# Patient Record
Sex: Female | Born: 1995 | Race: Black or African American | Hispanic: No | Marital: Single | Smoking: Never smoker
Health system: Southern US, Community
[De-identification: ages and names within clinical notes are randomized; demographics above are authoritative.]

## PROBLEM LIST (undated history)

## (undated) DIAGNOSIS — J45909 Unspecified asthma, uncomplicated: Secondary | ICD-10-CM

## (undated) HISTORY — PX: DENTAL SURGERY: SHX609

---
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
## 2020-02-09 ENCOUNTER — Ambulatory Visit: Admit: 2020-02-09 | Discharge: 2020-02-09 | Payer: BLUE CROSS/BLUE SHIELD | Attending: Family

## 2020-02-09 DIAGNOSIS — Z20822 Contact with and (suspected) exposure to covid-19: Secondary | ICD-10-CM

## 2020-02-09 NOTE — Progress Notes (Signed)
[facility]  [facility]   [address]  [place] [id number]  Dept: [phone]  Dept Fax: [phone]    Herbert Spiresngela A Theard is a 24 y.o. female who presents today forher medical conditions/complaints as noted below.  Herbert SpiresAngela A Bearman is c/o of   Chief Complaint   Patient presents with   ??? Pharyngitis     onset today otc sinus headache   ??? Otalgia   ??? Dizziness     HPI:     Pharyngitis  This is a new problem. The current episode started today. The problem occurs constantly. The problem has been gradually worsening. Associated symptoms include congestion, headaches and a sore throat. Pertinent negatives include no abdominal pain, chest pain, chills, coughing, fatigue, fever, nausea, neck pain, rash, vomiting or weakness. Associated symptoms comments: Ear fullness, nasal congestion, HA.   . The symptoms are aggravated by swallowing. The treatment provided mild relief.   OTC sinus meds     No past medical history on file.   No past surgical history on file.    No family history on file.    Social History     Tobacco Use   ??? Smoking status: Not on file   Substance Use Topics   ??? Alcohol use: Not on file      Current Outpatient Medications   Medication Sig Dispense Refill   ??? albuterol sulfate HFA 108 (90 Base) MCG/ACT inhaler albuterol sulfate HFA 90 mcg/actuation aerosol inhaler   INHALE 2 PUFFS BY MOUTH EVERY 4 HOURS AS NEEDED     ??? ferrous sulfate (IRON 325) 325 (65 Fe) MG tablet ferrous sulfate 325 mg (65 mg iron) tablet   Take 1 tablet every day by oral route as directed for 90 days.     ??? ibuprofen (ADVIL;MOTRIN) 600 MG tablet ibuprofen 600 mg tablet   TAKE 1 TABLET BY MOUTH EVERY 6 HOURS AS NEEDED FOR PAIN     ??? methocarbamol (ROBAXIN) 500 MG tablet methocarbamol 500 mg tablet   TAKE 1 TABLET BY MOUTH TWICE A DAY     ??? omeprazole (PRILOSEC) 20 MG delayed release capsule omeprazole 20 mg capsule,delayed release   TAKE 1 CAPSULE BY MOUTH EVERY DAY AS DIRECTED     ???  sertraline (ZOLOFT) 100 MG tablet sertraline 100 mg tablet   TAKE 1 TABLET BY MOUTH EVERY DAY     ??? traZODone (DESYREL) 50 MG tablet trazodone 50 mg tablet   TAKE 1/2 1 TABLET BY MOUTH AT BEDTIME AS NEEDED       No current facility-administered medications for this visit.      No Known Allergies        Subjective:      Review of Systems   Constitutional: Negative for appetite change, chills, fatigue and fever.   HENT: Positive for congestion and sore throat. Negative for postnasal drip.    Eyes: Negative.  Negative for discharge and itching.   Respiratory: Negative for cough, chest tightness and shortness of breath.    Cardiovascular: Negative for chest pain, palpitations and leg swelling.   Gastrointestinal: Negative for abdominal pain, diarrhea, nausea and vomiting.   Endocrine: Negative.    Genitourinary: Negative for dysuria, frequency and urgency.   Musculoskeletal: Negative for neck pain and neck stiffness.   Skin: Negative for rash.   Allergic/Immunologic: Negative.    Neurological: Positive for headaches. Negative for dizziness, weakness and light-headedness.   Hematological: Negative for adenopathy.  Psychiatric/Behavioral: Negative for confusion.       Objective:      Physical Exam  Vitals reviewed.   Constitutional:       Appearance: She is well-developed.   HENT:      Head: Normocephalic.      Right Ear: External ear normal. A middle ear effusion is present.      Left Ear: External ear normal. A middle ear effusion is present.      Nose: Nose normal.      Mouth/Throat:      Pharynx: Posterior oropharyngeal erythema present.   Eyes:      Conjunctiva/sclera: Conjunctivae normal.      Pupils: Pupils are equal, round, and reactive to light.   Cardiovascular:      Rate and Rhythm: Normal rate and regular rhythm.      Heart sounds: Normal heart sounds. No murmur heard.   No friction rub. No gallop.    Pulmonary:      Effort: Pulmonary effort is normal. No respiratory distress.      Breath sounds: Normal  breath sounds.   Abdominal:      General: Bowel sounds are normal.      Palpations: Abdomen is soft.   Musculoskeletal:         General: Normal range of motion.      Cervical back: Normal range of motion and neck supple.   Lymphadenopathy:      Cervical: No cervical adenopathy.   Skin:     General: Skin is warm and dry.      Findings: No rash.   Neurological:      Mental Status: She is alert and oriented to person, place, and time.      Cranial Nerves: No cranial nerve deficit.      Coordination: Coordination normal.      Deep Tendon Reflexes: Reflexes are normal and symmetric.   Psychiatric:         Thought Content: Thought content normal.       BP 103/70    Pulse 77    Temp 98.2 ??F (36.8 ??C) (Infrared)    SpO2 100%     Assessment:       Diagnosis Orders   1. Suspected COVID-19 virus infection     2. Sensation of fullness in both ears     3. Acute intractable headache, unspecified headache type     4. Nasal congestion     5. Sore throat             Plan:     1.) Covid swab obtained and sent to lab- will call with results   2.) Quarantine while waiting for Covid results   3.) Symptom management encouraged   4.) Follow-up with PCP PRN     Advance Care Planning  People with COVID-19 may have no symptoms, mild symptoms, such as fever, cough, and shortness of breath or they may have more severe illness, developing severe and fatal pneumonia.  As a result, Advance Care Planning with attention to naming a health care decision maker (someone you trust to make healthcare decisions for you if you could not speak for yourself) and sharing other health care preferences is important BEFORE a possible health crisis. Please contact your Primary Care Provider to discuss Advance Care Planning.    Preventing the Spread of Coronavirus Disease 2019 in Homes and Residential Communities  For the most recent information go to [email or web address]    Prevention steps for People  with  confirmed or suspected COVID-19 (including persons under investigation) who do not need to be hospitalized  and   People with confirmed COVID-19 who were hospitalized and determined to be medically stable to go home    Your healthcare provider and public health staff will evaluate whether you can be cared for at home. If it is determined that you do not need to be hospitalized and can be isolated at home, you will be monitored by staff from your local or state health department. You should follow the prevention steps below until a healthcare provider or local or state health department says you can return to your normal activities.  Stay home except to get medical care  People who are mildly ill with COVID-19 are able to isolate at home during their illness. You should restrict activities outside your home, except for getting medical care. Do not go to work, school, or public areas. Avoid using public transportation, ride-sharing, or taxis.  Separate yourself from other people and animals in your home  People: As much as possible, you should stay in a specific room and away from other people in your home. Also, you should use a separate bathroom, if available.  Animals: You should restrict contact with pets and other animals while you are sick with COVID-19, just like you would around other people. Although there have not been reports of pets or other animals becoming sick with COVID-19, it is still recommended that people sick with COVID-19 limit contact with animals until more information is known about the virus. When possible, have another member of your household care for your animals while you are sick. If you are sick with COVID-19, avoid contact with your pet, including petting, snuggling, being kissed or licked, and sharing food. If you must care for your pet or be around animals while you are sick, wash your hands before and after you interact with pets and wear a facemask.  Call ahead before visiting your  doctor  If you have a medical appointment, call the healthcare provider and tell them that you have or may have COVID-19. This will help the healthcare provider???s office take steps to keep other people from getting infected or exposed.  Wear a facemask  You should wear a facemask when you are around other people (e.g., sharing a room or vehicle) or pets and before you enter a healthcare provider???s office. If you are not able to wear a facemask (for example, because it causes trouble breathing), then people who live with you should not stay in the same room with you, or they should wear a facemask if they enter your room.  Cover your coughs and sneezes  Cover your mouth and nose with a tissue when you cough or sneeze. Throw used tissues in a lined trash can. Immediately wash your hands with soap and water for at least 20 seconds or, if soap and water are not available, clean your hands with an alcohol-based hand sanitizer that contains at least 60% alcohol.  Clean your hands often  Wash your hands often with soap and water for at least 20 seconds, especially after blowing your nose, coughing, or sneezing; going to the bathroom; and before eating or preparing food. If soap and water are not readily available, use an alcohol-based hand sanitizer with at least 60% alcohol, covering all surfaces of your hands and rubbing them together until they feel dry.  Soap and water are the best option if hands are visibly dirty. Avoid  touching your eyes, nose, and mouth with unwashed hands.  Avoid sharing personal household items  You should not share dishes, drinking glasses, cups, eating utensils, towels, or bedding with other people or pets in your home. After using these items, they should be washed thoroughly with soap and water.  Clean all ???high-touch??? surfaces everyday  High touch surfaces include counters, tabletops, doorknobs, bathroom fixtures, toilets, phones, keyboards, tablets, and bedside tables. Also, clean any  surfaces that may have blood, stool, or body fluids on them. Use a household cleaning spray or wipe, according to the label instructions. Labels contain instructions for safe and effective use of the cleaning product including precautions you should take when applying the product, such as wearing gloves and making sure you have good ventilation during use of the product.  Monitor your symptoms  Seek prompt medical attention if your illness is worsening (e.g., difficulty breathing). Before seeking care, call your healthcare provider and tell them that you have, or are being evaluated for, COVID-19. Put on a facemask before you enter the facility. These steps will help the healthcare provider???s office to keep other people in the office or waiting room from getting infected or exposed. Ask your healthcare provider to call the local or state health department. Persons who are placed under active monitoring or facilitated self-monitoring should follow instructions provided by their local health department or occupational health professionals, as appropriate. When working with your local health department check their available hours.  If you have a medical emergency and need to call 911, notify the dispatch personnel that you have, or are being evaluated for COVID-19. If possible, put on a facemask before emergency medical services arrive.  Discontinuing home isolation  Patients with confirmed COVID-19 should remain under home isolation precautions until the risk of secondary transmission to others is thought to be low. The decision to discontinue home isolation precautions should be made on a case-by-case basis, in consultation with healthcare providers and state and local health departments.    Problem List     None           Patient given educationalmaterials - see patient instructions.  Discussed use, benefit, and side effectsof prescribed medications.  All patient questions answered. Pt verbalized  understanding.Instructed to continue current medications, diet and exercise.  Patient agreedwith treatment plan. Follow up as directed.     Electronically signed by Felipe Drone, APRN - CNP on 03/30/2020 at 1:39 PM

## 2020-02-09 NOTE — Patient Instructions (Signed)
Advance Care Planning  People with COVID-19 may have no symptoms, mild symptoms, such as fever, cough, and shortness of breath or they may have more severe illness, developing severe and fatal pneumonia.  As a result, Advance Care Planning with attention to naming a health care decision maker (someone you trust to make healthcare decisions for you if you could not speak for yourself) and sharing other health care preferences is important BEFORE a possible health crisis. Please contact your Primary Care Provider to discuss Advance Care Planning.    Preventing the Spread of Coronavirus Disease 2019 in Homes and Residential Communities  For the most recent information go to https://www.cdc.gov/coronavirus/2019-ncov/hcp/guidance-prevent-spread.html    Prevention steps for People with confirmed or suspected COVID-19 (including persons under investigation) who do not need to be hospitalized  and   People with confirmed COVID-19 who were hospitalized and determined to be medically stable to go home    Your healthcare provider and public health staff will evaluate whether you can be cared for at home. If it is determined that you do not need to be hospitalized and can be isolated at home, you will be monitored by staff from your local or state health department. You should follow the prevention steps below until a healthcare provider or local or state health department says you can return to your normal activities.  Stay home except to get medical care  People who are mildly ill with COVID-19 are able to isolate at home during their illness. You should restrict activities outside your home, except for getting medical care. Do not go to work, school, or public areas. Avoid using public transportation, ride-sharing, or taxis.  Separate yourself from other people and animals in your home  People: As much as possible, you should stay in a specific room and away from other people in your home. Also, you should use a separate  bathroom, if available.  Animals: You should restrict contact with pets and other animals while you are sick with COVID-19, just like you would around other people. Although there have not been reports of pets or other animals becoming sick with COVID-19, it is still recommended that people sick with COVID-19 limit contact with animals until more information is known about the virus. When possible, have another member of your household care for your animals while you are sick. If you are sick with COVID-19, avoid contact with your pet, including petting, snuggling, being kissed or licked, and sharing food. If you must care for your pet or be around animals while you are sick, wash your hands before and after you interact with pets and wear a facemask.  Call ahead before visiting your doctor  If you have a medical appointment, call the healthcare provider and tell them that you have or may have COVID-19. This will help the healthcare provider???s office take steps to keep other people from getting infected or exposed.  Wear a facemask  You should wear a facemask when you are around other people (e.g., sharing a room or vehicle) or pets and before you enter a healthcare provider???s office. If you are not able to wear a facemask (for example, because it causes trouble breathing), then people who live with you should not stay in the same room with you, or they should wear a facemask if they enter your room.  Cover your coughs and sneezes  Cover your mouth and nose with a tissue when you cough or sneeze. Throw used tissues in a lined trash   can. Immediately wash your hands with soap and water for at least 20 seconds or, if soap and water are not available, clean your hands with an alcohol-based hand sanitizer that contains at least 60% alcohol.  Clean your hands often  Wash your hands often with soap and water for at least 20 seconds, especially after blowing your nose, coughing, or sneezing; going to the bathroom; and  before eating or preparing food. If soap and water are not readily available, use an alcohol-based hand sanitizer with at least 60% alcohol, covering all surfaces of your hands and rubbing them together until they feel dry.  Soap and water are the best option if hands are visibly dirty. Avoid touching your eyes, nose, and mouth with unwashed hands.  Avoid sharing personal household items  You should not share dishes, drinking glasses, cups, eating utensils, towels, or bedding with other people or pets in your home. After using these items, they should be washed thoroughly with soap and water.  Clean all ???high-touch??? surfaces everyday  High touch surfaces include counters, tabletops, doorknobs, bathroom fixtures, toilets, phones, keyboards, tablets, and bedside tables. Also, clean any surfaces that may have blood, stool, or body fluids on them. Use a household cleaning spray or wipe, according to the label instructions. Labels contain instructions for safe and effective use of the cleaning product including precautions you should take when applying the product, such as wearing gloves and making sure you have good ventilation during use of the product.  Monitor your symptoms  Seek prompt medical attention if your illness is worsening (e.g., difficulty breathing). Before seeking care, call your healthcare provider and tell them that you have, or are being evaluated for, COVID-19. Put on a facemask before you enter the facility. These steps will help the healthcare provider???s office to keep other people in the office or waiting room from getting infected or exposed. Ask your healthcare provider to call the local or state health department. Persons who are placed under active monitoring or facilitated self-monitoring should follow instructions provided by their local health department or occupational health professionals, as appropriate. When working with your local health department check their available hours.  If you  have a medical emergency and need to call 911, notify the dispatch personnel that you have, or are being evaluated for COVID-19. If possible, put on a facemask before emergency medical services arrive.  Discontinuing home isolation  Patients with confirmed COVID-19 should remain under home isolation precautions until the risk of secondary transmission to others is thought to be low. The decision to discontinue home isolation precautions should be made on a case-by-case basis, in consultation with healthcare providers and state and local health departments.

## 2020-02-11 LAB — COVID-19: SARS-CoV-2: NOT DETECTED

## 2020-06-04 ENCOUNTER — Ambulatory Visit: Admit: 2020-06-04 | Discharge: 2020-06-04 | Payer: BLUE CROSS/BLUE SHIELD | Attending: Family

## 2020-06-04 ENCOUNTER — Inpatient Hospital Stay: Payer: BLUE CROSS/BLUE SHIELD

## 2020-06-04 DIAGNOSIS — Z20822 Contact with and (suspected) exposure to covid-19: Secondary | ICD-10-CM

## 2020-06-04 MED ORDER — FLUTICASONE PROPIONATE 50 MCG/ACT NA SUSP
50 MCG/ACT | Freq: Every day | NASAL | 0 refills | Status: AC
Start: 2020-06-04 — End: ?

## 2020-06-04 MED ORDER — PREDNISONE 20 MG PO TABS
20 MG | ORAL_TABLET | Freq: Two times a day (BID) | ORAL | 0 refills | Status: AC
Start: 2020-06-04 — End: 2020-06-09

## 2020-06-04 MED ORDER — BENZONATATE 100 MG PO CAPS
100 MG | ORAL_CAPSULE | Freq: Three times a day (TID) | ORAL | 0 refills | Status: AC | PRN
Start: 2020-06-04 — End: 2020-06-11

## 2020-06-04 NOTE — Progress Notes (Signed)
[facility] [place], [facility]  [address]  [place] [address]  Dept: [phone]  Dept Fax: [phone]    Dawn Patrick is a 25 y.o. female who presents to the urgent care today for her medical conditions/complaints as notedbelow.  [name] is c/o of Cough (onset 2 days ), Congestion, and Pharyngitis      HPI:     25 yr old female presents for Cough (onset 2 days ), Congestion, and Pharyngitis  Hx asthma, breathing heavier  covid vax: 2 doses    Cough  This is a new problem. The current episode started in the past 7 days. The problem has been waxing and waning. The cough is non-productive. Associated symptoms include chills, headaches, postnasal drip, rhinorrhea, shortness of breath and sweats. Pertinent negatives include no chest pain, ear congestion, ear pain, fever, heartburn, hemoptysis, myalgias, nasal congestion, rash, weight loss or wheezing. The symptoms are aggravated by lying down. She has tried a beta-agonist inhaler for the symptoms. Her past medical history is significant for asthma, bronchitis and environmental allergies. There is no history of bronchiectasis, COPD, emphysema or pneumonia.   Pharyngitis  Associated symptoms include chills, coughing and headaches. Pertinent negatives include no chest pain, fever, myalgias or rash.       No past medical history on file.     Current Outpatient Medications   Medication Sig Dispense Refill   ??? predniSONE (DELTASONE) 20 MG tablet Take 1 tablet by mouth 2 times daily for 5 days 10 tablet 0   ??? fluticasone (FLONASE) 50 MCG/ACT nasal spray 2 sprays by Nasal route daily 16 g 0   ??? benzonatate (TESSALON PERLES) 100 MG capsule Take 1 capsule by mouth 3 times daily as needed for Cough 21 capsule 0   ??? albuterol sulfate HFA 108 (90 Base) MCG/ACT inhaler albuterol sulfate HFA 90 mcg/actuation aerosol inhaler   INHALE 2 PUFFS BY MOUTH EVERY 4 HOURS AS NEEDED     ??? ferrous  sulfate (IRON 325) 325 (65 Fe) MG tablet ferrous sulfate 325 mg (65 mg iron) tablet   Take 1 tablet every day by oral route as directed for 90 days.     ??? ibuprofen (ADVIL;MOTRIN) 600 MG tablet ibuprofen 600 mg tablet   TAKE 1 TABLET BY MOUTH EVERY 6 HOURS AS NEEDED FOR PAIN     ??? sertraline (ZOLOFT) 100 MG tablet sertraline 100 mg tablet   TAKE 1 TABLET BY MOUTH EVERY DAY     ??? traZODone (DESYREL) 50 MG tablet trazodone 50 mg tablet   TAKE 1/2 1 TABLET BY MOUTH AT BEDTIME AS NEEDED     ??? methocarbamol (ROBAXIN) 500 MG tablet methocarbamol 500 mg tablet   TAKE 1 TABLET BY MOUTH TWICE A DAY (Patient not taking: Reported on 06/04/2020)     ??? omeprazole (PRILOSEC) 20 MG delayed release capsule omeprazole 20 mg capsule,delayed release   TAKE 1 CAPSULE BY MOUTH EVERY DAY AS DIRECTED (Patient not taking: Reported on 06/04/2020)       No current facility-administered medications for this visit.     Allergies   Allergen Reactions   ??? Penicillins        Subjective:      Review of Systems   Constitutional: Positive for chills. Negative for fever and weight loss.   HENT: Positive for postnasal drip and rhinorrhea. Negative for ear pain.    Respiratory: Positive for cough and  shortness of breath. Negative for hemoptysis and wheezing.    Cardiovascular: Negative for chest pain.   Gastrointestinal: Negative for heartburn.   Musculoskeletal: Negative for myalgias.   Skin: Negative for rash.   Allergic/Immunologic: Positive for environmental allergies.   Neurological: Positive for headaches.   All other systems reviewed and are negative.    14 systems reviewed and negative except as listed in HPI.    Objective:     Physical Exam  Vitals and nursing note reviewed.   Constitutional:       General: She is not in acute distress.     Appearance: Normal appearance. She is well-developed. She is not ill-appearing, toxic-appearing or diaphoretic.   HENT:      Head: Normocephalic and atraumatic.      Right Ear: Tympanic membrane, ear canal and  external ear normal.      Left Ear: Tympanic membrane, ear canal and external ear normal.      Nose: Rhinorrhea present.      Mouth/Throat:      Mouth: Mucous membranes are moist.      Pharynx: No oropharyngeal exudate or posterior oropharyngeal erythema.      Comments: Thin white PND  Eyes:      General: No scleral icterus.        Right eye: No discharge.         Left eye: No discharge.      Extraocular Movements: Extraocular movements intact.      Conjunctiva/sclera: Conjunctivae normal.      Pupils: Pupils are equal, round, and reactive to light.   Cardiovascular:      Rate and Rhythm: Normal rate and regular rhythm.      Pulses: Normal pulses.      Heart sounds: Normal heart sounds.   Pulmonary:      Effort: Pulmonary effort is normal. No respiratory distress.      Breath sounds: Normal breath sounds. No stridor. No wheezing, rhonchi or rales.      Comments: Dry hacky cough  Chest:      Chest wall: No tenderness.   Abdominal:      General: Bowel sounds are normal. There is no distension.      Palpations: Abdomen is soft.      Tenderness: There is no abdominal tenderness.   Musculoskeletal:         General: No tenderness or deformity. Normal range of motion.      Cervical back: Normal range of motion and neck supple.   Lymphadenopathy:      Cervical: No cervical adenopathy.   Skin:     General: Skin is warm and dry.      Capillary Refill: Capillary refill takes less than 2 seconds.      Findings: No rash.      Comments: No rash to visible skin   Neurological:      General: No focal deficit present.      Mental Status: She is alert and oriented to person, place, and time.      Motor: No abnormal muscle tone.      Coordination: Coordination normal.   Psychiatric:         Mood and Affect: Mood normal.         Behavior: Behavior normal.       BP 120/80    Pulse 83    Temp 98.2 ??F (36.8 ??C)    SpO2 98%     Assessment:  Diagnosis Orders   1. Suspected COVID-19 virus infection  COVID-19       Plan:    hx asthma,  has had chest tightness, dry hacky cough on exam  vss  ls clr t/o no fevers  pred rx  Tess perles rx  flonase rx  Home alb inhaler - pt declines refill  Reviewed over-the-counter treatments for symptom management.  Will send out COVID19 testing. Possible treatment alterations based on the results.  Patient instructed to self-quarantine until testing results are back.  Patient instructed not to return to work until results are back.  Tylenol as needed for fever/pain.  Encouraged adequate hydration and rest.  The patient indicates understanding of these issues and agrees with the plan.  Educational materials provided on AVS.  Follow up if symptoms do not improve/worsen. Discussed symptoms that will warrant urgent ED evaluation/treatment.    Return if symptoms worsen or fail to improve, for Make an Appt. with your Primary Care in 1 week.    Orders Placed This Encounter   Medications   ??? predniSONE (DELTASONE) 20 MG tablet     Sig: Take 1 tablet by mouth 2 times daily for 5 days     Dispense:  10 tablet     Refill:  0   ??? fluticasone (FLONASE) 50 MCG/ACT nasal spray     Sig: 2 sprays by Nasal route daily     Dispense:  16 g     Refill:  0   ??? benzonatate (TESSALON PERLES) 100 MG capsule     Sig: Take 1 capsule by mouth 3 times daily as needed for Cough     Dispense:  21 capsule     Refill:  0         Patient given educational materials - see patient instructions.  Discussed use, benefit, and side effects of prescribed medications.  All patient questions answered.  Pt voicedunderstanding.    Electronically signed by Sharion Settler, APRN - CNP on 06/04/2020 at 2:26 PM

## 2020-06-04 NOTE — Patient Instructions (Signed)
Follow up with family doctor in 1 week as needed.  Return immediately if worse, new symptoms develop, symptoms persist or have any questions or concerns.  Push fluids, keep hydrated  Cool mist humidifier bedside  Continue all medications as prescribed  May alternate tylenol/motrin over the counter for pain or fever, take per package instructions.  The COVID-19 test that was done today can take 1-6 days for results.  Until then you should assume you have this disease and adhere to home isolation as described below.    When we get the test results back, one of the following readings will be obtained.   1. A positive test means you have the virus.    2.  An inconclusive test means it wasn't sure if you have the virus or not.   An inconclusive test result is treated as a positive result and recommendations  are the same as a positive test result.  We may ask you to repeat this test in this circumstance.    3.  A negative test means you probably do not have the virus, but it is not conclusive.      If your results of the COVID-19 test is POSITIVE- you must isolate yourself from others.    You can be around others after:    10 days since symptoms first appeared (immunocompromised will quarantine for 20 days) and  24 hours with no fever without the use of fever-reducing medications and  Other symptoms of COVID-19 are improving*  *Loss of taste and smell may persist for weeks or months after recovery and need not delay the end of isolation  For people who are immunocopromised, quarantine may last for 20 days.    Most people do not require testing to decide when they can be around others; however, if your healthcare provider recommends testing, they will let you know when you can resume being around others based on your test results.    Note that these recommendations do not apply to persons with severe COVID-19 or with severely weakened immune systems (immunocompromised). These persons should follow the guidance below for  ???I was severely ill with COVID-19 or have a severely weakened immune system (immunocompromised) due to a health condition or medication. When can I be around others????    CDC does NOT recommend retesting to determine if the infection has resolved, when to end home isolation or whether to discontinue precautions in a healthcare setting.  Your test can remain positive for 90 days, but that does not mean you are contagious.  Some patients who are immunocompromised or had severe illness are sometimes retested in consultation with infectious disease experts.    https://www.cdc.gov/coronavirus/2019-ncov/if-you-are-sick/end-home-isolation.html    Prevention steps for People with positive or inconclusive test results or suspected  COVID-19 (including persons under investigation) who do not need to be hospitalized  and   People with confirmed COVID-19 who were hospitalized and determined to be medically stable to go home    Contacts who are NOT healthcare providers or first responders and are asymptomatic (no fever,  cough, shortness of breath, or difficulty breathing) should self-quarantine for 14 days from the last  date of exposure to confirmed COVID-19.    Your healthcare provider and public health staff will evaluate whether you can be cared for at home. If it is determined that you do not need to be hospitalized and can be isolated at home, you will be monitored by staff from your health department. You should follow the   prevention steps below until a healthcare provider or local or state health department says you can return to your normal activities.  Stay home except to get medical care  People who are mildly ill with COVID-19 are able to isolate at home during their illness. You should restrict activities outside your home, except for getting medical care. Do not go to work, school, or public areas. Avoid using public transportation, ride-sharing, or taxis.  Separate yourself from other people and animals in your  home  People: As much as possible, you should stay in a specific room and away from other people in your home. Also, you should use a separate bathroom, if available.  Animals: You should restrict contact with pets and other animals while you are sick with COVID-19, just like you would around other people. When possible, have another member of your household care for your animals while you are sick. If you are sick with COVID-19, avoid contact with your pet, including petting, snuggling, being kissed or licked, and sharing food. If you must care for your pet or be around animals while you are sick, wash your hands before and after you interact with pets and wear a facemask.  Call ahead before visiting your doctor  If you have a medical appointment, call the healthcare provider and tell them that you have or may have COVID-19. This will help the healthcare provider???s office take steps to keep other people from getting infected or exposed.  Wear a facemask  You should wear a facemask when you are around other people (e.g., sharing a room or vehicle) or pets and before you enter a healthcare provider???s office. If you are not able to wear a facemask (for example, because it causes trouble breathing), then people who live with you should not stay in the same room with you; they should also wear a facemask if they enter your room.  Cover your coughs and sneezes  Cover your mouth and nose with a tissue when you cough or sneeze. Throw used tissues in a lined trash can. Immediately wash your hands with soap and water for at least 20 seconds or, if soap and water are not available, clean your hands with an alcohol-based hand sanitizer that contains at least 60% alcohol.  Clean your hands often  Wash your hands often with soap and water for at least 20 seconds, especially after blowing your nose, coughing, or sneezing; going to the bathroom; and before eating or preparing food. If soap and water are not readily available, use  an alcohol-based hand sanitizer with at least 60% alcohol, covering all surfaces of your hands and rubbing them together until they feel dry.  Soap and water are the best option if hands are visibly dirty. Avoid touching your eyes, nose, and mouth with unwashed hands.  Avoid sharing personal household items  You should not share dishes, drinking glasses, cups, eating utensils, towels, or bedding with other people or pets in your home. After using these items, they should be washed thoroughly with soap and water.  Clean all ???high-touch??? surfaces everyday  High touch surfaces include counters, tabletops, doorknobs, bathroom fixtures, toilets, phones, keyboards, tablets, and bedside tables. Also, clean any surfaces that may have blood, stool, or body fluids on them. Use a household cleaning spray or wipe, according to the label instructions. Labels contain instructions for safe and effective use of the cleaning product including precautions you should take when applying the product, such as wearing gloves and making sure   you have good ventilation during use of the product.  Monitor your symptoms  Seek prompt medical attention if your illness is worsening (e.g., difficulty breathing). Before seeking care, call your healthcare provider and tell them that you have, or are being evaluated for, COVID-19. Put on a facemask before you enter the facility. These steps will help the healthcare provider???s office to keep other people in the office or waiting room from getting infected or exposed. Persons who are placed under active monitoring or facilitated self-monitoring should follow instructions provided by their local health department or occupational health professionals, as appropriate. When working with your local health department check their available hours.  If you have a medical emergency and need to call 911, notify the dispatch personnel that you have, or are being evaluated for COVID-19. If possible, put on a  facemask before emergency medical services arrive.  Discontinuing home isolation  Patients with confirmed COVID-19 should remain under home isolation precautions until the risk of secondary transmission to others is thought to be low. The decision to discontinue home isolation precautions should be made on a case-by-case basis, in consultation with your physician and the health department.  Please do NOT make this decision on your own.          Quarantine- keeps someone who might have been exposed to the virus away from others  Isolation - keeps someone who is infected with the virus away from others, even in their homes    Scenario 1    Your patient has close contact with an individual who has COVID-19.  Your patient will not have further contact.  Plan - Your patient is quarantined from the last day of contact for 14 days    Scenario 2   Your patient has lives with someone who has COVID-19 but can avoid further contact.  Plan - Your patient is quarantined for 14 days starting when the person with COVID-19 begins home isolation.    Scenario 3    Your patient is under quarantine and has additional close contact with someone else who has COVID-19.  Plan - Your patient must restart quarantine from the last COVID-19 exposure.    Scenario 4   Your patient lives with someone who has COVID-19 and cannot avoid close contact from them.    Plan - Your patient is quarantined while the other person is isolating and for 14 days after covid - 19 person meets the criteria to end home isolation.      Treatment with monclonoal antibodies is under investigation and can be considered for patients with mild to moderate COVID-19 who are not on supplemental oxygen and are not hospitalized but who are at HIGH RISK for clinical progression have had onset of symptoms within the past 10 days.     HIGH RISK is defined as patients who meet at least one of the following criteria:  ???  Have a body mass index (BMI) ?35  ???  Have chronic kidney  disease  ???  Have diabetes  ???  Have immunosuppressive disease  ???  Are currently receiving immunosuppressive treatment  ???  Are ?24 years of age  ???  Are ?24 years of age AND have  *cardiovascular disease, OR  *hypertension, OR  *chronic obstructive pulmonary disease/other chronic respiratory disease.  ???  Are 12 - 17 years of age AND have  *BMI ?85th percentile for their age and gender based on CDC growth charts, https://www.cdc.gov/growthcharts/clinical_charts.htm , OR  *sickle cell disease, OR  *  congenital or acquired heart disease, OR  *neurodevelopmental disorders, for example, cerebral palsy, OR  *a medical-related technological dependence, for example, tracheostomy, gastrostomy, or positive pressure ventilation (not related to   COVID-19), OR  *asthma, reactive airway or other chronic respiratory disease that requires daily medication for control.  Other risk factors:  ??? Moderate/severe dementia  ??? Cancer being treated with palliative care  ??? Cirrhosis  ??? Pregnancy  ??? Breast feeding  ??? Weight less than 40Kg (88lbs)      If your results of the COVID-19 test is NEGATIVE -     The patient may stop isolation, in consultation with your health care provider, typically when:  Your healthcare provider has determined that the cause of the illness is NOT COVID-19 and approves your return to work.  OR  Ten (10) days have passed since onset of symptoms AND one day (24 hours) have passed with no fever without taking medication (like Tylenol) to reduce fever,  respiratory symptoms have resolved and you have been evaluated by your health care provider.  Please follow up with your physician for evaluation about this.          COVID-19 EXPOSURE    Quarantine  Quarantine if you have been in close contact (within 6 feet of someone for a cumulative total of 15 minutes or more over a 24-hour period) with someone who has COVID-19, unless you have been fully vaccinated. People who are fully vaccinated do NOT need to quarantine after  contact with someone who had COVID-19 unless they have symptoms. However, fully vaccinated people should get tested 3-5 days after their exposure, even if they don???t have symptoms and wear a mask indoors in public for 14 days following exposure or until their test result is negative.    The following websites are the best places for up to date information on this fluid situation.    Https://www.cdc.gov/coronavirus    The following applies to a person who has clinically recovered from  SARS-CoV-2 infection that was confirmed with a viral diagnostic test and then, within 3 months since the date of symptom onset of the previous illness episode (or date of positive viral diagnostic test if the person never experienced symptoms), is identified as a contact of a new case.  If the person remains asymptomatic since the new exposure, then they do not need to be retested for SARS-CoV-2 and do not need to be quarantined. However, if the person experiences new symptoms consistent with COVID-19 and an evaluation fails to identify a diagnosis other than SARS-CoV-2 infection (e.g., influenza), then repeat viral diagnostic testing may be warranted, in consultation with an infectious disease specialist and public health authorities for isolation guidance.

## 2020-06-06 LAB — COVID-19: SARS-CoV-2: DETECTED — AB

## 2020-10-31 ENCOUNTER — Encounter (HOSPITAL_COMMUNITY): Payer: Self-pay

## 2020-10-31 ENCOUNTER — Other Ambulatory Visit: Payer: Self-pay

## 2020-10-31 ENCOUNTER — Ambulatory Visit (HOSPITAL_COMMUNITY)
Admission: RE | Admit: 2020-10-31 | Discharge: 2020-10-31 | Disposition: A | Discharge status: Home / Self Care | Payer: Managed Care, Other (non HMO) | Source: Ambulatory Visit | Attending: Urgent Care | Admitting: Urgent Care

## 2020-10-31 VITALS — BP 116/66 | HR 67 | Temp 98.1°F | Resp 18

## 2020-10-31 DIAGNOSIS — Z79899 Other long term (current) drug therapy: Secondary | ICD-10-CM | POA: Insufficient documentation

## 2020-10-31 DIAGNOSIS — J069 Acute upper respiratory infection, unspecified: Secondary | ICD-10-CM | POA: Insufficient documentation

## 2020-10-31 DIAGNOSIS — Z20822 Contact with and (suspected) exposure to covid-19: Secondary | ICD-10-CM | POA: Insufficient documentation

## 2020-10-31 DIAGNOSIS — J45909 Unspecified asthma, uncomplicated: Secondary | ICD-10-CM | POA: Diagnosis not present

## 2020-10-31 HISTORY — DX: Unspecified asthma, uncomplicated: J45.909

## 2020-10-31 LAB — SARS CORONAVIRUS 2 (TAT 6-24 HRS): SARS Coronavirus 2: NEGATIVE

## 2020-10-31 MED ORDER — ALBUTEROL SULFATE HFA 108 (90 BASE) MCG/ACT IN AERS: 1.0000 | INHALATION_SPRAY | Freq: Four times a day (QID) | RESPIRATORY_TRACT | 0 refills | Status: AC | PRN

## 2020-10-31 MED ORDER — PROMETHAZINE-DM 6.25-15 MG/5ML PO SYRP: 5.0000 mL | ORAL_SOLUTION | Freq: Every evening | ORAL | 0 refills | Status: AC | PRN

## 2020-10-31 MED ORDER — PREDNISONE 10 MG PO TABS: 10.0000 mg | ORAL_TABLET | Freq: Every day | ORAL | 0 refills | Status: AC

## 2020-10-31 MED ORDER — PSEUDOEPHEDRINE HCL 60 MG PO TABS: 60.0000 mg | ORAL_TABLET | Freq: Three times a day (TID) | ORAL | 0 refills | Status: AC | PRN

## 2020-10-31 MED ORDER — CETIRIZINE HCL 10 MG PO TABS: 10.0000 mg | ORAL_TABLET | Freq: Every day | ORAL | 0 refills | Status: AC

## 2020-10-31 NOTE — ED Provider Notes (Signed)
[facility]   MRN: [id number] DOB: [birth year]  Subjective:   Jennifer Stanley is a 25 y.o. female presenting for 10 day history of acute onset malaise including runny nose, watery eyes, throat pain, swollen lymph nodes around her neck, headaches, diarrhea, bilateral ear popping, fatigue.  Had a negative rapid COVID test on 05/26.  Has used Tylenol Cold for her symptoms.  She just ran out of her albuterol inhaler.  Denies active chest pain, shortness of breath, fevers.  She is COVID vaccinated but does not have the booster.  No current facility-administered medications for this encounter.  Current Outpatient Medications:  .  fluticasone (FLONASE) 50 MCG/ACT nasal spray, Place 2 sprays into both nostrils daily., Disp: , Rfl:  .  sertraline (ZOLOFT) 100 MG tablet, Take 1.5 tablets by mouth daily., Disp: , Rfl:  .  traZODone (DESYREL) 50 MG tablet, Take 25-50 mg by mouth at bedtime as needed., Disp: , Rfl:    Allergies  Allergen Reactions  . Penicillins     Past Medical History:  Diagnosis Date  . Asthma      Past Surgical History:  Procedure Laterality Date  . DENTAL SURGERY      Family History  Problem Relation Age of Onset  . Thyroid disease Mother     Social History   Tobacco Use  . Smoking status: Never Smoker  Vaping Use  . Vaping Use: Some days  Substance Use Topics  . Alcohol use: Yes  . Drug use: Never    ROS   Objective:   Vitals: BP 116/66 (BP Location: Left Arm)   Pulse 67   Temp 98.1 F (36.7 C) (Oral)   Resp 18   LMP 10/16/2020   SpO2 100%   Physical Exam Constitutional:      General: She is not in acute distress.    Appearance: Normal appearance. She is well-developed. She is not ill-appearing, toxic-appearing or diaphoretic.  HENT:     Head: Normocephalic and atraumatic.     Right Ear: Tympanic membrane, ear canal and external ear normal. No drainage or tenderness. No middle ear effusion. Tympanic membrane is not  erythematous.     Left Ear: Tympanic membrane, ear canal and external ear normal. No drainage or tenderness.  No middle ear effusion. Tympanic membrane is not erythematous.     Nose: Rhinorrhea present. No congestion.     Mouth/Throat:     Mouth: Mucous membranes are moist. No oral lesions.     Pharynx: No pharyngeal swelling, oropharyngeal exudate, posterior oropharyngeal erythema or uvula swelling.     Tonsils: No tonsillar exudate or tonsillar abscesses.     Comments: Cobblestone pattern of postnasal drainage overlying pharynx.  Tonsils absent from tonsillectomy. Eyes:     General: No scleral icterus.       Right eye: No discharge.        Left eye: No discharge.     Extraocular Movements: Extraocular movements intact.     Right eye: Normal extraocular motion.     Left eye: Normal extraocular motion.     Conjunctiva/sclera: Conjunctivae normal.     Pupils: Pupils are equal, round, and reactive to light.  Cardiovascular:     Rate and Rhythm: Normal rate and regular rhythm.     Pulses: Normal pulses.     Heart sounds: Normal heart sounds. No murmur heard. No friction rub. No gallop.   Pulmonary:     Effort: Pulmonary effort is normal. No respiratory distress.  Breath sounds: Normal breath sounds. No stridor. No wheezing, rhonchi or rales.  Musculoskeletal:     Cervical back: Normal range of motion and neck supple.  Lymphadenopathy:     Cervical: No cervical adenopathy.  Skin:    General: Skin is warm and dry.     Findings: No rash.  Neurological:     General: No focal deficit present.     Mental Status: She is alert and oriented to person, place, and time.  Psychiatric:        Mood and Affect: Mood normal.        Behavior: Behavior normal.        Thought Content: Thought content normal.        Judgment: Judgment normal.     Assessment and Plan :   PDMP not reviewed this encounter.  1. Viral URI with cough     Will manage for viral illness such as viral URI, viral  syndrome, viral rhinitis, COVID-19. Counseled patient on nature of COVID-19 including modes of transmission, diagnostic testing, management and supportive care.  Offered scripts for symptomatic relief. COVID 19 testing is pending. Counseled patient on potential for adverse effects with medications prescribed/recommended today, ER and return-to-clinic precautions discussed, patient verbalized understanding.     Wallis Bamberg, PA-C 10/31/20 1252

## 2020-10-31 NOTE — ED Triage Notes (Signed)
Returned from [place] on 5/23.  Started feeling bad on 5/24.  Patient drove to and from destination.  Had a covid test (negative) on 5/26.  Symptoms have included swollen lymph nodes, diarrhea , headache, runny nose, watery eye.  Patient states she has a slight sore throat and ears are stuffy

## 2020-10-31 NOTE — Discharge Instructions (Addendum)
We will notify you of your COVID-19 test results as they arrive and may take between 24 to 48 hours.  I encourage you to sign up for MyChart if you have not already done so as this can be the easiest way for [place] to communicate results to you online or through a phone app.  In the meantime, if you develop worsening symptoms including fever, chest pain, shortness of breath despite our current treatment plan then please report to the emergency room as this may be a sign of worsening status from possible COVID-19 infection.  Otherwise, we will manage this as a viral syndrome. For sore throat or cough try using a honey-based tea. Use 3 teaspoons of honey with juice squeezed from half lemon. Place shaved pieces of ginger into 1/2-1 cup of water and warm over stove top. Then mix the ingredients and repeat every 4 hours as needed. Please take Tylenol 500mg -650mg  every 6 hours for aches and pains, fevers. Hydrate very well with at least 2 liters of water. Eat light meals such as soups to replenish electrolytes and soft fruits, veggies. Start an antihistamine like Zyrtec, Allegra or Claritin for postnasal drainage, sinus congestion.  You can take this together with pseudoephedrine (Sudafed) after you finish the prednisone course at a dose of 60 mg 2-3 times a day as needed for the same kind of congestion.

## 2020-11-27 ENCOUNTER — Encounter (HOSPITAL_COMMUNITY): Payer: Self-pay

## 2020-11-27 ENCOUNTER — Other Ambulatory Visit: Payer: Self-pay

## 2020-11-27 ENCOUNTER — Ambulatory Visit (HOSPITAL_COMMUNITY)
Admission: EM | Admit: 2020-11-27 | Discharge: 2020-11-27 | Disposition: A | Discharge status: Home / Self Care | Payer: Managed Care, Other (non HMO) | Attending: Emergency Medicine | Admitting: Emergency Medicine

## 2020-11-27 ENCOUNTER — Emergency Department (HOSPITAL_COMMUNITY)
Admission: EM | Admit: 2020-11-27 | Discharge: 2020-11-27 | Disposition: A | Discharge status: Home / Self Care | Payer: Managed Care, Other (non HMO) | Attending: Emergency Medicine | Admitting: Emergency Medicine

## 2020-11-27 DIAGNOSIS — Z7951 Long term (current) use of inhaled steroids: Secondary | ICD-10-CM | POA: Insufficient documentation

## 2020-11-27 DIAGNOSIS — J45909 Unspecified asthma, uncomplicated: Secondary | ICD-10-CM | POA: Insufficient documentation

## 2020-11-27 DIAGNOSIS — N939 Abnormal uterine and vaginal bleeding, unspecified: Secondary | ICD-10-CM | POA: Diagnosis not present

## 2020-11-27 DIAGNOSIS — N938 Other specified abnormal uterine and vaginal bleeding: Secondary | ICD-10-CM

## 2020-11-27 DIAGNOSIS — R102 Pelvic and perineal pain: Secondary | ICD-10-CM | POA: Diagnosis present

## 2020-11-27 LAB — CBC WITH DIFFERENTIAL/PLATELET
Abs Immature Granulocytes: 0.01 10*3/uL (ref 0.00–0.07)
Basophils Absolute: 0 10*3/uL (ref 0.0–0.1)
Basophils Relative: 0 %
Eosinophils Absolute: 0 10*3/uL (ref 0.0–0.5)
Eosinophils Relative: 1 %
HCT: 33.7 % — ABNORMAL LOW (ref 36.0–46.0)
Hemoglobin: 10.2 g/dL — ABNORMAL LOW (ref 12.0–15.0)
Immature Granulocytes: 0 %
Lymphocytes Relative: 28 %
Lymphs Abs: 1.5 10*3/uL (ref 0.7–4.0)
MCH: 26.4 pg (ref 26.0–34.0)
MCHC: 30.3 g/dL (ref 30.0–36.0)
MCV: 87.3 fL (ref 80.0–100.0)
Monocytes Absolute: 0.4 10*3/uL (ref 0.1–1.0)
Monocytes Relative: 7 %
Neutro Abs: 3.4 10*3/uL (ref 1.7–7.7)
Neutrophils Relative %: 64 %
Platelets: 307 10*3/uL (ref 150–400)
RBC: 3.86 MIL/uL — ABNORMAL LOW (ref 3.87–5.11)
RDW: 14.2 % (ref 11.5–15.5)
WBC: 5.4 10*3/uL (ref 4.0–10.5)
nRBC: 0 % (ref 0.0–0.2)

## 2020-11-27 LAB — POCT URINALYSIS DIPSTICK, ED / UC
Bilirubin Urine: NEGATIVE
Glucose, UA: NEGATIVE mg/dL
Ketones, ur: NEGATIVE mg/dL
Leukocytes,Ua: NEGATIVE
Nitrite: NEGATIVE
Protein, ur: NEGATIVE mg/dL
Specific Gravity, Urine: 1.01 (ref 1.005–1.030)
Urobilinogen, UA: 0.2 mg/dL (ref 0.0–1.0)
pH: 5.5 (ref 5.0–8.0)

## 2020-11-27 LAB — COMPREHENSIVE METABOLIC PANEL
ALT: 12 U/L (ref 0–44)
AST: 21 U/L (ref 15–41)
Albumin: 3.8 g/dL (ref 3.5–5.0)
Alkaline Phosphatase: 105 U/L (ref 38–126)
Anion gap: 6 (ref 5–15)
BUN: <5 mg/dL — ABNORMAL LOW (ref 6–20)
CO2: 25 mmol/L (ref 22–32)
Calcium: 8.9 mg/dL (ref 8.9–10.3)
Chloride: 103 mmol/L (ref 98–111)
Creatinine, Ser: 0.57 mg/dL (ref 0.44–1.00)
GFR, Estimated: >60 mL/min (ref >60)
Glucose, Bld: 96 mg/dL (ref 70–99)
Potassium: 3.7 mmol/L (ref 3.5–5.1)
Sodium: 134 mmol/L — ABNORMAL LOW (ref 135–145)
Total Bilirubin: 0.7 mg/dL (ref 0.3–1.2)
Total Protein: 7.2 g/dL (ref 6.5–8.1)

## 2020-11-27 LAB — I-STAT BETA HCG BLOOD, ED (MC, WL, AP ONLY): I-stat hCG, quantitative: <5 m[IU]/mL (ref <5)

## 2020-11-27 LAB — SAMPLE TO BLOOD BANK

## 2020-11-27 LAB — POC URINE PREG, ED: Preg Test, Ur: NEGATIVE

## 2020-11-27 MED ORDER — KETOROLAC TROMETHAMINE 60 MG/2ML IM SOLN
15.0000 mg | Freq: Once | INTRAMUSCULAR | Status: DC
  Filled 2020-11-27: qty 2

## 2020-11-27 NOTE — ED Triage Notes (Addendum)
Pt reports moderate to heavy vaginal bleeding x18 days. Pt is using 4 over night pads per day. Pt is concerned her Hgb is low. Hx of anemia.  Pt reports she recently got a new female dog who is "having a cycle", pt feels maybe she has synced her cycle with her new dog like females do with other females.

## 2020-11-27 NOTE — Discharge Instructions (Addendum)
Go to the Emergency Department for further evaluation of dizziness and abnormal bleeding.

## 2020-11-27 NOTE — ED Provider Notes (Signed)
Emergency Medicine Provider Triage Evaluation Note  Jennifer Stanley , a 25 y.o. female  was evaluated in triage.  Pt complains of heavy menstrual cycle.  She states that she normally has very heavy menstrual cycles however she has been bleeding for the past 18 days.  She states that it started off with light menstrual cycle and then got heavier however is continued.  She is using overnight hands and frequently going to the bathroom and going through about 4 overnight pads a day.  She has a history of iron deficiency anemia, is not supplementing with iron supplements or eating red meat at this time.  She reports that when she stands up she feels lightheaded and dizzy and is feeling very fatigued..  Review of Systems  Positive: Vaginal bleeding, light headed, fatigue Negative: Syncope, fevers  Physical Exam  BP 117/79 (BP Location: Left Arm)   Pulse 71   Temp 98.4 F (36.9 C) (Oral)   Resp 16   LMP 11/10/2020 (Exact Date)   SpO2 100%  Gen:   Awake, no distress   Resp:  Normal effort  MSK:   Moves extremities without difficulty  Other:  Patient is awake and alert, answers questions appropriately.  Speech is nonslurred.  Medical Decision Making  Medically screening exam initiated at 1:18 PM.  Appropriate orders placed.  Laketa Sandoz was informed that the remainder of the evaluation will be completed by another provider, this initial triage assessment does not replace that evaluation, and the importance of remaining in the ED until their evaluation is complete.  Note: Portions of this report may have been transcribed using voice recognition software. Every effort was made to ensure accuracy; however, inadvertent computerized transcription errors may be present    Cristina Gong, PA-C 11/27/20 1319    Pricilla Loveless, MD 11/30/20 [phone]

## 2020-11-27 NOTE — ED Provider Notes (Addendum)
[facility]    CSN: [id number] Arrival date & time: 11/27/20  1029      History   Chief Complaint Chief Complaint  Patient presents with   Vaginal Bleeding    HPI Jennifer Stanley is a 25 y.o. female.   Patient here for evaluation of vaginal bleeding that has been ongoing for the past 18 days.  Reports period normally lasts about 7 days.  Reports bleeding was normal and then got lighter before getting heavier again.  Denies any history of abnormal bleeding or irregular periods.  Reports feeling light headed this am.  Denies any trauma, injury, or other precipitating event.  Denies any specific alleviating or aggravating factors.  Denies any fevers, chest pain, shortness of breath, N/V/D, numbness, tingling, weakness, abdominal pain, or headaches.     The history is provided by the patient.  Vaginal Bleeding  Past Medical History:  Diagnosis Date   Asthma     There are no problems to display for this patient.   Past Surgical History:  Procedure Laterality Date   DENTAL SURGERY      OB History   No obstetric history on file.      Home Medications    Prior to Admission medications   Medication Sig Start Date End Date Taking? Authorizing Provider  albuterol (VENTOLIN HFA) 108 (90 Base) MCG/ACT inhaler Inhale 1-2 puffs into the lungs every 6 (six) hours as needed for wheezing or shortness of breath. 10/31/20   Wallis Bamberg, PA-C  cetirizine (ZYRTEC ALLERGY) 10 MG tablet Take 1 tablet (10 mg total) by mouth daily. 10/31/20   Wallis Bamberg, PA-C  fluticasone (FLONASE) 50 MCG/ACT nasal spray Place 2 sprays into both nostrils daily. 06/04/20   [provider]  predniSONE (DELTASONE) 10 MG tablet Take 1 tablet (10 mg total) by mouth daily with breakfast. 10/31/20   Wallis Bamberg, PA-C  promethazine-dextromethorphan (PROMETHAZINE-DM) 6.25-15 MG/5ML syrup Take 5 mLs by mouth at bedtime as needed for cough. 10/31/20   Wallis Bamberg, PA-C  pseudoephedrine (SUDAFED) 60 MG tablet  Take 1 tablet (60 mg total) by mouth every 8 (eight) hours as needed for congestion. 10/31/20   Wallis Bamberg, PA-C  sertraline (ZOLOFT) 100 MG tablet Take 1.5 tablets by mouth daily. 09/09/20   [provider]  traZODone (DESYREL) 50 MG tablet Take 25-50 mg by mouth at bedtime as needed. 09/02/20   [provider]    Family History Family History  Problem Relation Age of Onset   Thyroid disease Mother     Social History Social History   Tobacco Use   Smoking status: Never   Smokeless tobacco: Never  Vaping Use   Vaping Use: Some days  Substance Use Topics   Alcohol use: Yes   Drug use: Never     Allergies   Penicillins   Review of Systems Review of Systems  Genitourinary:  Positive for vaginal bleeding.  Neurological:  Positive for light-headedness.  All other systems reviewed and are negative.   Physical Exam Triage Vital Signs ED Triage Vitals  Enc Vitals Group     BP 11/27/20 1102 115/85     Pulse Rate 11/27/20 1102 72     Resp 11/27/20 1102 20     Temp 11/27/20 1102 98.2 F (36.8 C)     Temp Source 11/27/20 1102 Oral     SpO2 11/27/20 1102 98 %     Weight --      Height --  Head Circumference --      Peak Flow --      Pain Score 11/27/20 1100 0     Pain Loc --      Pain Edu? --      Excl. in GC? --    No data found.  Updated Vital Signs BP 115/85 (BP Location: Left Arm)   Pulse 72   Temp 98.2 F (36.8 C) (Oral)   Resp 20   LMP 11/10/2020 (Exact Date)   SpO2 98%   Visual Acuity Right Eye Distance:   Left Eye Distance:   Bilateral Distance:    Right Eye Near:   Left Eye Near:    Bilateral Near:     Physical Exam Vitals and nursing note reviewed.  Constitutional:      General: She is not in acute distress.    Appearance: Normal appearance. She is not ill-appearing, toxic-appearing or diaphoretic.  HENT:     Head: Normocephalic and atraumatic.  Eyes:     Conjunctiva/sclera: Conjunctivae normal.  Cardiovascular:      Rate and Rhythm: Normal rate.     Pulses: Normal pulses.  Pulmonary:     Effort: Pulmonary effort is normal.  Abdominal:     General: Abdomen is flat.  Musculoskeletal:        General: Normal range of motion.     Cervical back: Normal range of motion.  Skin:    General: Skin is warm and dry.  Neurological:     General: No focal deficit present.     Mental Status: She is alert and oriented to person, place, and time.  Psychiatric:        Mood and Affect: Mood normal.     UC Treatments / Results  Labs (all labs ordered are listed, but only abnormal results are displayed) Labs Reviewed  POCT URINALYSIS DIPSTICK, ED / UC - Abnormal; Notable for the following components:      Result Value   Hgb urine dipstick LARGE (*)    All other components within normal limits  POC URINE PREG, ED    EKG   Radiology No results found.  Procedures Procedures (including critical care time)  Medications Ordered in UC Medications - No data to display  Initial Impression / Assessment and Plan / UC Course  I have reviewed the triage vital signs and the nursing notes.  Pertinent labs & imaging results that were available during my care of the patient were reviewed by me and considered in my medical decision making (see chart for details).    Patient very tearful during exam and reports that she has almost passed out several times this morning, including when she was trying to obtain a urine sample here.  Patient reports hemoglobin was low at last PCP visit but was unsure what the results were.  Last documented hemoglobin was 10.4.  Offered to draw CBC here and if low recommend going to the emergency room for further evaluation or patient may go to the emergency room now.  Patient elected to go to the ED at this time for further follow-up.  Patient escorted to the ED with office staff.  Final Clinical Impressions(s) / UC Diagnoses   Final diagnoses:  Abnormal uterine bleeding     Discharge  Instructions      Go to the Emergency Department for further evaluation of dizziness and abnormal bleeding.        ED Prescriptions   None    PDMP not reviewed this  encounter.   Ivette Loyal, NP 11/27/20 1136    Ivette Loyal, NP 11/27/20 1137

## 2020-11-27 NOTE — ED Notes (Signed)
Pt is refusing discharge at this time. Reports dizziness and wants an ultrasound. MD cleared pt for discharge at this time with GYN follow up. Pt wants to talk to charge nurse at this time. CN notified is currently talking to pt.

## 2020-11-27 NOTE — Discharge Instructions (Addendum)
Follow up with GYN in the office.  Return for worsening bleeding, passing out.   Take 4 over the counter ibuprofen tablets 3 times a day or 2 over-the-counter naproxen tablets twice a day for pain. Also take tylenol 1000mg (2 extra strength) four times a day.

## 2020-11-27 NOTE — ED Triage Notes (Addendum)
Pt in with c/o vaginal bleeding x 18 days  Pt states she felt like she was going to pass out when she woke up this morning  Pt states her longest menstrual cycle has only been 7 days   Pt denies any urinary sx

## 2020-11-27 NOTE — ED Provider Notes (Signed)
[facility] EMERGENCY DEPARTMENT Provider Note   CSN: [id number] Arrival date & time: 11/27/20  1138     History Chief Complaint  Patient presents with   Vaginal Bleeding    Jennifer Stanley is a 25 y.o. female.  25 yo F with a chief complaints of vaginal bleeding.  Going on for almost 3 weeks now.  Just when she thinks it stopped it is started up again.  She is concerned because she typically has painful menstrual cycles since she was 25 years old but feels like this is much longer course than she typically has.  Has been feeling a bit lightheaded and dizzy over the past couple days and so decided to come to be evaluated.  She initially went to urgent care and they told her they could do the blood work but would not have the results immediately and recommended to come to the ED.  The history is provided by the patient and a friend.  Vaginal Bleeding Quality:  Bright red and clots Severity:  Moderate Onset quality:  Gradual Duration:  18 days Timing:  Intermittent Progression:  Worsening Chronicity:  New Menstrual history:  Regular Possible pregnancy: no   Relieved by:  Nothing Worsened by:  Nothing Ineffective treatments:  None tried Associated symptoms: no dizziness, no dysuria, no fever and no nausea       Past Medical History:  Diagnosis Date   Asthma     There are no problems to display for this patient.   Past Surgical History:  Procedure Laterality Date   DENTAL SURGERY       OB History   No obstetric history on file.     Family History  Problem Relation Age of Onset   Thyroid disease Mother     Social History   Tobacco Use   Smoking status: Never   Smokeless tobacco: Never  Vaping Use   Vaping Use: Some days  Substance Use Topics   Alcohol use: Yes   Drug use: Never    Home Medications Prior to Admission medications   Medication Sig Start Date End Date Taking? Authorizing Provider  albuterol (VENTOLIN HFA) 108 (90 Base)  MCG/ACT inhaler Inhale 1-2 puffs into the lungs every 6 (six) hours as needed for wheezing or shortness of breath. 10/31/20   Wallis Bamberg, PA-C  cetirizine (ZYRTEC ALLERGY) 10 MG tablet Take 1 tablet (10 mg total) by mouth daily. 10/31/20   Wallis Bamberg, PA-C  fluticasone (FLONASE) 50 MCG/ACT nasal spray Place 2 sprays into both nostrils daily. 06/04/20   [provider]  predniSONE (DELTASONE) 10 MG tablet Take 1 tablet (10 mg total) by mouth daily with breakfast. 10/31/20   Wallis Bamberg, PA-C  promethazine-dextromethorphan (PROMETHAZINE-DM) 6.25-15 MG/5ML syrup Take 5 mLs by mouth at bedtime as needed for cough. 10/31/20   Wallis Bamberg, PA-C  pseudoephedrine (SUDAFED) 60 MG tablet Take 1 tablet (60 mg total) by mouth every 8 (eight) hours as needed for congestion. 10/31/20   Wallis Bamberg, PA-C  sertraline (ZOLOFT) 100 MG tablet Take 1.5 tablets by mouth daily. 09/09/20   [provider]  traZODone (DESYREL) 50 MG tablet Take 25-50 mg by mouth at bedtime as needed. 09/02/20   [provider]    Allergies    Penicillins  Review of Systems   Review of Systems  Constitutional:  Negative for chills and fever.  HENT:  Negative for congestion and rhinorrhea.   Eyes:  Negative for redness and visual disturbance.  Respiratory:  Negative for shortness of breath and wheezing.   Cardiovascular:  Negative for chest pain and palpitations.  Gastrointestinal:  Negative for nausea and vomiting.  Genitourinary:  Positive for hematuria, menstrual problem, pelvic pain and vaginal bleeding. Negative for dysuria and urgency.  Musculoskeletal:  Negative for arthralgias and myalgias.  Skin:  Negative for pallor and wound.  Neurological:  Negative for dizziness and headaches.   Physical Exam Updated Vital Signs BP (!) 143/101 (BP Location: Right Arm)   Pulse 85   Temp 98 F (36.7 C) (Oral)   Resp 15   LMP 11/10/2020 (Exact Date)   SpO2 100%   Physical Exam Vitals and nursing note reviewed.   Constitutional:      General: She is not in acute distress.    Appearance: She is well-developed. She is not diaphoretic.  HENT:     Head: Normocephalic and atraumatic.  Eyes:     Pupils: Pupils are equal, round, and reactive to light.  Cardiovascular:     Rate and Rhythm: Normal rate and regular rhythm.     Heart sounds: No murmur heard.   No friction rub. No gallop.  Pulmonary:     Effort: Pulmonary effort is normal.     Breath sounds: No wheezing or rales.  Abdominal:     General: There is no distension.     Palpations: Abdomen is soft.     Tenderness: There is no abdominal tenderness.     Comments: Benign abdominal exam  Musculoskeletal:        General: No tenderness.     Cervical back: Normal range of motion and neck supple.  Skin:    General: Skin is warm and dry.  Neurological:     Mental Status: She is alert and oriented to person, place, and time.  Psychiatric:        Behavior: Behavior normal.    ED Results / Procedures / Treatments   Labs (all labs ordered are listed, but only abnormal results are displayed) Labs Reviewed  CBC WITH DIFFERENTIAL/PLATELET - Abnormal; Notable for the following components:      Result Value   RBC 3.86 (*)    Hemoglobin 10.2 (*)    HCT 33.7 (*)    All other components within normal limits  COMPREHENSIVE METABOLIC PANEL - Abnormal; Notable for the following components:   Sodium 134 (*)    BUN <5 (*)    All other components within normal limits  I-STAT BETA HCG BLOOD, ED ([facility], [facility], [facility] ONLY)  SAMPLE TO BLOOD BANK    EKG EKG Interpretation  Date/Time:  Wednesday November 27 2020 13:17:06 EDT Ventricular Rate:  73 PR Interval:  160 QRS Duration: 88 QT Interval:  372 QTC Calculation: 409 R Axis:   -4 Text Interpretation: Normal sinus rhythm with sinus arrhythmia Minimal voltage criteria for LVH, may be normal variant ( R in aVL ) Borderline ECG No old tracing to compare Confirmed by Melene Plan [phone]) on 11/27/2020 5:07:40  PM  Radiology No results found.  Procedures Procedures   Medications Ordered in ED Medications  ketorolac (TORADOL) injection 15 mg (15 mg Intramuscular Patient Refused/Not Given 11/27/20 1737)    ED Course  I have reviewed the triage vital signs and the nursing notes.  Pertinent labs & imaging results that were available during my care of the patient were reviewed by me and considered in my medical decision making (see chart for details).    MDM Rules/Calculators/A&P  25 yo F with a chief complaints of vaginal bleeding.  Going on for 18 days now.  Patient is well-appearing and nontoxic has a benign abdominal exam.  Hemoglobin of 10.2.  Pregnancy test negative.  I discussed the results with the patient.  Recommending following up as an outpatient.  The patient is requesting an ultrasound.  I discussed with her at length about how this is typically an outpatient evaluation.  She is given follow-up for the local women's clinic.  5:39 PM:  I have discussed the diagnosis/risks/treatment options with the patient and believe the pt to be eligible for discharge home to follow-up with GYN. We also discussed returning to the ED immediately if new or worsening sx occur. We discussed the sx which are most concerning (e.g., sudden worsening pain, fever, inability to tolerate by mouth) that necessitate immediate return. Medications administered to the patient during their visit and any new prescriptions provided to the patient are listed below.  Medications given during this visit Medications  ketorolac (TORADOL) injection 15 mg (15 mg Intramuscular Patient Refused/Not Given 11/27/20 1737)     The patient appears reasonably screen and/or stabilized for discharge and I doubt any other medical condition or other [facility] requiring further screening, evaluation, or treatment in the ED at this time prior to discharge.     Final Clinical Impression(s) / ED Diagnoses Final  diagnoses:  Dysfunctional uterine bleeding    Rx / DC Orders ED Discharge Orders     None        Melene Plan, DO 11/27/20 1739

## 2020-11-27 NOTE — ED Notes (Signed)
Patient is being discharged from the Urgent Care and sent to the Emergency Department via UC staff . Per Chales Salmon, NP, patient is in need of higher level of care due to dizziness. Patient is aware and verbalizes understanding of plan of care.  Vitals:   11/27/20 1102  BP: 115/85  Pulse: 72  Resp: 20  Temp: 98.2 F (36.8 C)  SpO2: 98%

## 2021-01-02 ENCOUNTER — Ambulatory Visit: Payer: Managed Care, Other (non HMO) | Attending: Family

## 2021-01-02 DIAGNOSIS — Z23 Encounter for immunization: Secondary | ICD-10-CM

## 2021-01-02 NOTE — Progress Notes (Signed)
   Covid-19 Vaccination Clinic  Name:  Jaline Pincock    MRN: [id number] DOB: 23-Nov-1995  01/02/2021  Ms. Regan was observed post Covid-19 immunization for 15 minutes without incident. She was provided with Vaccine Information Sheet and instruction to access the V-Safe system.   Ms. Messman was instructed to call 911 with any severe reactions post vaccine: Difficulty breathing  Swelling of face and throat  A fast heartbeat  A bad rash all over body  Dizziness and weakness   Immunizations Administered     Name Date Dose VIS Date Route   Pfizer COVID-19 Vaccine 01/02/2021  2:15 PM 0.3 mL 03/20/2020 Intramuscular   Manufacturer: [employer], [employer]   Lot: [id number]   NDC: [address]-0

## 2021-07-27 ENCOUNTER — Emergency Department (HOSPITAL_COMMUNITY): Payer: Self-pay

## 2021-07-27 ENCOUNTER — Emergency Department (HOSPITAL_COMMUNITY)
Admission: EM | Admit: 2021-07-27 | Discharge: 2021-07-27 | Disposition: A | Discharge status: Home / Self Care | Payer: Self-pay | Attending: Emergency Medicine | Admitting: Emergency Medicine

## 2021-07-27 ENCOUNTER — Encounter (HOSPITAL_COMMUNITY): Payer: Self-pay | Admitting: Emergency Medicine

## 2021-07-27 ENCOUNTER — Other Ambulatory Visit: Payer: Self-pay

## 2021-07-27 DIAGNOSIS — R109 Unspecified abdominal pain: Secondary | ICD-10-CM

## 2021-07-27 DIAGNOSIS — Z7951 Long term (current) use of inhaled steroids: Secondary | ICD-10-CM | POA: Insufficient documentation

## 2021-07-27 DIAGNOSIS — K5900 Constipation, unspecified: Secondary | ICD-10-CM | POA: Insufficient documentation

## 2021-07-27 DIAGNOSIS — R1030 Lower abdominal pain, unspecified: Secondary | ICD-10-CM | POA: Insufficient documentation

## 2021-07-27 DIAGNOSIS — Z79899 Other long term (current) drug therapy: Secondary | ICD-10-CM | POA: Insufficient documentation

## 2021-07-27 LAB — URINALYSIS, ROUTINE W REFLEX MICROSCOPIC
Bilirubin Urine: NEGATIVE
Glucose, UA: NEGATIVE mg/dL
Ketones, ur: NEGATIVE mg/dL
Nitrite: NEGATIVE
Protein, ur: NEGATIVE mg/dL
Specific Gravity, Urine: 1.013 (ref 1.005–1.030)
pH: 6 (ref 5.0–8.0)

## 2021-07-27 LAB — COMPREHENSIVE METABOLIC PANEL
ALT: 15 U/L (ref 0–44)
AST: 22 U/L (ref 15–41)
Albumin: 3.3 g/dL — ABNORMAL LOW (ref 3.5–5.0)
Alkaline Phosphatase: 51 U/L (ref 38–126)
Anion gap: 10 (ref 5–15)
BUN: 7 mg/dL (ref 6–20)
CO2: 21 mmol/L — ABNORMAL LOW (ref 22–32)
Calcium: 8.6 mg/dL — ABNORMAL LOW (ref 8.9–10.3)
Chloride: 103 mmol/L (ref 98–111)
Creatinine, Ser: 0.77 mg/dL (ref 0.44–1.00)
GFR, Estimated: >60 mL/min (ref >60)
Glucose, Bld: 107 mg/dL — ABNORMAL HIGH (ref 70–99)
Potassium: 4.1 mmol/L (ref 3.5–5.1)
Sodium: 134 mmol/L — ABNORMAL LOW (ref 135–145)
Total Bilirubin: 0.4 mg/dL (ref 0.3–1.2)
Total Protein: 6.9 g/dL (ref 6.5–8.1)

## 2021-07-27 LAB — CBC
HCT: 30.5 % — ABNORMAL LOW (ref 36.0–46.0)
Hemoglobin: 9 g/dL — ABNORMAL LOW (ref 12.0–15.0)
MCH: 24.7 pg — ABNORMAL LOW (ref 26.0–34.0)
MCHC: 29.5 g/dL — ABNORMAL LOW (ref 30.0–36.0)
MCV: 83.8 fL (ref 80.0–100.0)
Platelets: 369 10*3/uL (ref 150–400)
RBC: 3.64 MIL/uL — ABNORMAL LOW (ref 3.87–5.11)
RDW: 14.3 % (ref 11.5–15.5)
WBC: 9.5 10*3/uL (ref 4.0–10.5)
nRBC: 0 % (ref 0.0–0.2)

## 2021-07-27 LAB — LIPASE, BLOOD: Lipase: 28 U/L (ref 11–51)

## 2021-07-27 LAB — I-STAT BETA HCG BLOOD, ED (MC, WL, AP ONLY): I-stat hCG, quantitative: <5 m[IU]/mL (ref <5)

## 2021-07-27 NOTE — ED Triage Notes (Signed)
Reports R sided abd pain x 1 week with constipation and nausea.  Denies vomiting and urinary complaints.  States when she was able to have a BM it had mucous in it.

## 2021-07-27 NOTE — Discharge Instructions (Addendum)
Your CT scan did not show any worrisome findings.  Take MiraLAX as needed daily for any additional constipation.  Follow-up with your primary care doctor within 2 or 3 days, return back to the ER if you have worsening pain fevers or cannot keep down any fluids.

## 2021-07-27 NOTE — ED Provider Notes (Signed)
[facility] EMERGENCY DEPARTMENT Provider Note   CSN: [id number] Arrival date & time: 07/27/21  1216     History  Chief Complaint  Patient presents with   Abdominal Pain    Jennifer Stanley is a 26 y.o. female.  Patient presents with complaint of abdominal pain.  Described as lower abdominal pain ongoing for 2 to 3 weeks.  Perhaps worse in the last week.  She states has been having some constipation but had a bowel movement yesterday.  However she noticed some mucus with her bowel movement otherwise denies any bleeding.  Denies any vomiting.  Denies any fevers or cough or diarrhea.      Home Medications Prior to Admission medications   Medication Sig Start Date End Date Taking? Authorizing Provider  albuterol (VENTOLIN HFA) 108 (90 Base) MCG/ACT inhaler Inhale 1-2 puffs into the lungs every 6 (six) hours as needed for wheezing or shortness of breath. 10/31/20   Wallis Bamberg, PA-C  cetirizine (ZYRTEC ALLERGY) 10 MG tablet Take 1 tablet (10 mg total) by mouth daily. 10/31/20   Wallis Bamberg, PA-C  fluticasone (FLONASE) 50 MCG/ACT nasal spray Place 2 sprays into both nostrils daily. 06/04/20   [provider]  predniSONE (DELTASONE) 10 MG tablet Take 1 tablet (10 mg total) by mouth daily with breakfast. 10/31/20   Wallis Bamberg, PA-C  promethazine-dextromethorphan (PROMETHAZINE-DM) 6.25-15 MG/5ML syrup Take 5 mLs by mouth at bedtime as needed for cough. 10/31/20   Wallis Bamberg, PA-C  pseudoephedrine (SUDAFED) 60 MG tablet Take 1 tablet (60 mg total) by mouth every 8 (eight) hours as needed for congestion. 10/31/20   Wallis Bamberg, PA-C  sertraline (ZOLOFT) 100 MG tablet Take 1.5 tablets by mouth daily. 09/09/20   [provider]  traZODone (DESYREL) 50 MG tablet Take 25-50 mg by mouth at bedtime as needed. 09/02/20   [provider]      Allergies    Penicillins    Review of Systems   Review of Systems  Constitutional:  Negative for fever.  HENT:  Negative for  ear pain.   Eyes:  Negative for pain.  Respiratory:  Negative for cough.   Cardiovascular:  Negative for chest pain.  Gastrointestinal:  Positive for abdominal pain.  Genitourinary:  Negative for flank pain.  Musculoskeletal:  Negative for back pain.  Skin:  Negative for rash.  Neurological:  Negative for headaches.   Physical Exam Updated Vital Signs BP 121/76    Pulse 77    Temp 98.8 F (37.1 C) (Oral)    Resp 16    LMP 07/09/2021    SpO2 100%  Physical Exam Constitutional:      General: She is not in acute distress.    Appearance: Normal appearance.  HENT:     Head: Normocephalic.     Nose: Nose normal.  Eyes:     Extraocular Movements: Extraocular movements intact.  Cardiovascular:     Rate and Rhythm: Normal rate.  Pulmonary:     Effort: Pulmonary effort is normal.  Abdominal:     Tenderness: There is no abdominal tenderness. There is no guarding or rebound.  Musculoskeletal:        General: Normal range of motion.     Cervical back: Normal range of motion.  Neurological:     General: No focal deficit present.     Mental Status: She is alert. Mental status is at baseline.    ED Results / Procedures / Treatments   Labs (all labs  ordered are listed, but only abnormal results are displayed) Labs Reviewed  COMPREHENSIVE METABOLIC PANEL - Abnormal; Notable for the following components:      Result Value   Sodium 134 (*)    CO2 21 (*)    Glucose, Bld 107 (*)    Calcium 8.6 (*)    Albumin 3.3 (*)    All other components within normal limits  CBC - Abnormal; Notable for the following components:   RBC 3.64 (*)    Hemoglobin 9.0 (*)    HCT 30.5 (*)    MCH 24.7 (*)    MCHC 29.5 (*)    All other components within normal limits  URINALYSIS, ROUTINE W REFLEX MICROSCOPIC - Abnormal; Notable for the following components:   APPearance HAZY (*)    Hgb urine dipstick LARGE (*)    Leukocytes,Ua MODERATE (*)    Bacteria, UA RARE (*)    All other components within normal  limits  LIPASE, BLOOD  I-STAT BETA HCG BLOOD, ED ([facility], [facility], [facility] ONLY)    EKG None  Radiology CT Abdomen Pelvis Wo Contrast  Result Date: 07/27/2021 CLINICAL DATA:  Right-sided abdominal pain for 1 week. Constipation. Nausea. EXAM: CT ABDOMEN AND PELVIS WITHOUT CONTRAST TECHNIQUE: Multidetector CT imaging of the abdomen and pelvis was performed following the standard protocol without IV contrast. RADIATION DOSE REDUCTION: This exam was performed according to the departmental dose-optimization program which includes automated exposure control, adjustment of the mA and/or kV according to patient size and/or use of iterative reconstruction technique. COMPARISON:  None. FINDINGS: Lower chest: Clear lung bases. Hepatobiliary: No focal liver abnormality is seen. No gallstones, gallbladder wall thickening, or biliary dilatation. Pancreas: Unremarkable. No pancreatic ductal dilatation or surrounding inflammatory changes. Spleen: Normal in size without focal abnormality. Adrenals/Urinary Tract: Adrenal glands are unremarkable. Kidneys are normal, without renal calculi, focal lesion, or hydronephrosis. Bladder is unremarkable. Stomach/Bowel: Appendix not definitively seen, but suggested extending posteriorly from the cecal tip to merge with the adnexal structures. This structure is 6 mm in diameter top normal in size for appendix. There is no adjacent inflammation. No findings are seen to suggest appendicitis. Normal stomach. Small bowel and colon are normal in caliber. No wall thickening. No inflammation. Vascular/Lymphatic: No significant vascular findings are present. No enlarged abdominal or pelvic lymph nodes. Reproductive: Uterus and bilateral adnexa are unremarkable. Other: No abdominal wall hernia or abnormality. No abdominopelvic ascites. Musculoskeletal: No acute or significant osseous findings. IMPRESSION: 1. Normal exam. No evidence of appendicitis. No findings to account for the patient's pain.  Electronically Signed   By: Amie Portland M.D.   On: 07/27/2021 14:55    Procedures Procedures    Medications Ordered in ED Medications - No data to display  ED Course/ Medical Decision Making/ A&P                           Medical Decision Making Amount and/or Complexity of Data Reviewed Labs: ordered. Radiology: ordered.   Review of records shows follow-up on outpatient basis on April 02, 2021 for menorrhagia.  Blood work today shows normal chemistry.  CBC shows mild anemia.  No evidence of urinary tract infection.  CT abdomen pelvis pursued with no acute findings per radiology.  Patient advised outpatient follow-up with a doctor within the week, advising immediate return for worsening pain fevers or any additional concerns.  Recommending MiraLAX for any additional constipation she may have.  Final Clinical Impression(s) / ED Diagnoses Final diagnoses:  Abdominal pain, unspecified abdominal location    Rx / DC Orders ED Discharge Orders     None         Cheryll Cockayne, MD 07/27/21 1555

## 2022-03-07 LAB — GLUCOSE, POCT (MANUAL RESULT ENTRY): POC Glucose: 88 mg/dl (ref 70–99)

## 2022-05-13 ENCOUNTER — Encounter: Payer: Self-pay | Admitting: *Deleted

## 2022-05-13 NOTE — Progress Notes (Signed)
Pt contacted and states she has student health insurance until 05/31/22, that her b/p was up bc she had not eaten. This caller suggested the diastolic value might not be affected by not eating and still warranted follow up. Pt has not PCP, and will lose insurance in 2 weeks, so requested called email her contact info for a PCP. [facility] and cover letter emailed to her confirmed email address as requested.

## 2022-07-07 ENCOUNTER — Encounter: Payer: Self-pay | Admitting: *Deleted

## 2022-07-07 NOTE — Progress Notes (Signed)
When pt's phone called, pt answered and then hung up. Last preferred method of contact per pt was email as she was moving and losing her insurance and not sure who she would use as her PCP. As of this contact attempt, there is no documentation in [facility] since first event follow up indicating pt has PCP or has accessed a healthcare provider. Second letter generated and sent via My Chart and pt's preferred email ([email or web address]) offering PCP options and contact info, expanded Medicaid info if pt has no active insurance, and an SDOH questionnaire in case pt is encountering barriers to healthcare access.

## 2022-11-04 ENCOUNTER — Encounter: Payer: Self-pay | Admitting: *Deleted

## 2022-11-04 NOTE — Progress Notes (Signed)
Pt attended 03/07/22 screening event where b/p was 128/99. During first event f/u, pt was contacted and stated she thought her b/p was elevated bc she had not eaten. Health equity team member offered that PCP f/u would be beneficial to at least monitor her b/p. Pt agreed to have team email her (not mail letter, because she was finishing school and moving and [employer]) so PCP contact info was emailed to her on 05/13/22. For second f/u attempt on 07/07/21, call made but not connected to pt and 2nd letter with PCP, and insurance info along with an SDOH questionnaire in case pt was experiencing SDOH barriers to healthcare access - all was emailed as well as My Chart letter sent. To date, per chart review, there is no [facility] documentation that SDOH questionnaire was returned and not PCP documentation visible in [facility]. Final letter sent via My Chart including Get Care Now flyer as well as updated flyers about Medicaid and [employer] access. No additional health equity team support scheduled at this time.

## 2023-03-26 ENCOUNTER — Other Ambulatory Visit: Payer: Self-pay | Admitting: Ophthalmology

## 2023-03-26 DIAGNOSIS — H4710 Unspecified papilledema: Secondary | ICD-10-CM

## 2023-03-26 DIAGNOSIS — G932 Benign intracranial hypertension: Secondary | ICD-10-CM

## 2023-04-01 ENCOUNTER — Other Ambulatory Visit: Payer: Self-pay | Admitting: Family Medicine

## 2023-04-01 ENCOUNTER — Ambulatory Visit
Admission: RE | Admit: 2023-04-01 | Discharge: 2023-04-01 | Disposition: A | Discharge status: Home / Self Care | Payer: BC Managed Care – PPO | Source: Ambulatory Visit | Attending: Family Medicine | Admitting: Family Medicine

## 2023-04-01 DIAGNOSIS — S060X0A Concussion without loss of consciousness, initial encounter: Secondary | ICD-10-CM

## 2023-05-02 ENCOUNTER — Inpatient Hospital Stay: Admission: RE | Admit: 2023-05-02 | Payer: Self-pay | Source: Ambulatory Visit

## 2023-05-02 ENCOUNTER — Other Ambulatory Visit: Payer: Self-pay

## 2023-10-05 IMAGING — CT CT ABD-PELV W/O CM
2 of 4 series · 16 of 46 positions shown, 18 images · non-contrast
Comparison: None.

CLINICAL DATA: Right-sided abdominal pain for 1 week. Constipation.
Nausea.



[Series 3: abd/ pelvis 5.0 i30f 2 · axial · 0.88mm/px · z∈[-741,-326]mm · 13 of 91 slices shown, 15 images]
[im 4/91  soft-tissue]
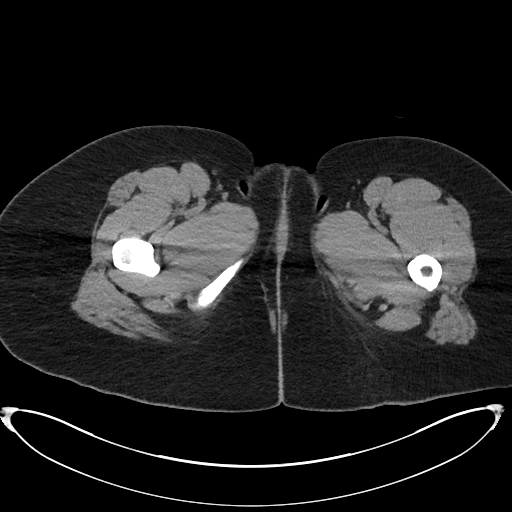
[im 4/91  bone]
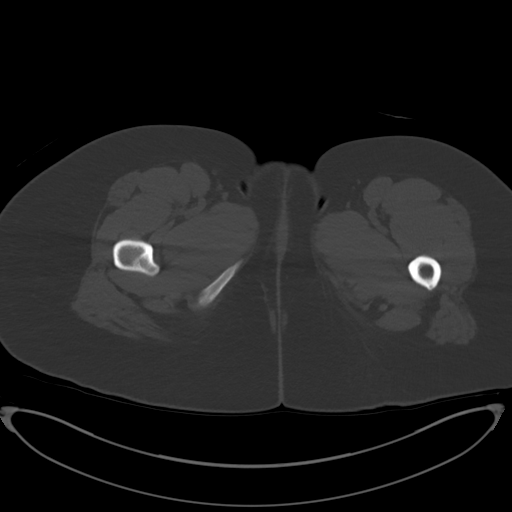
[im 11/91  soft-tissue]
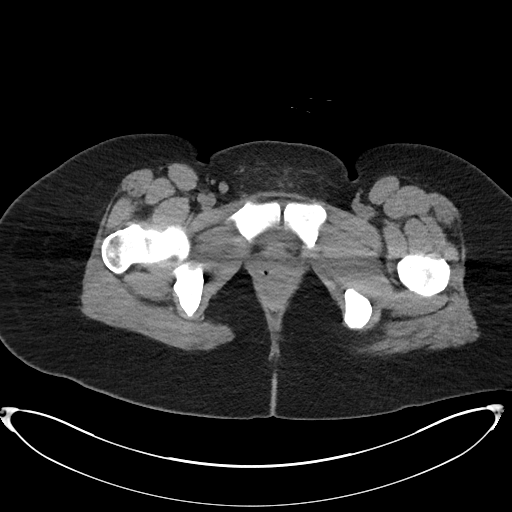
[im 19/91  soft-tissue]
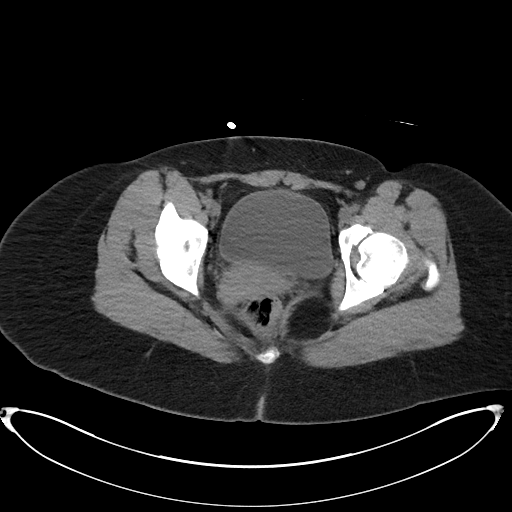
[im 26/91  soft-tissue]
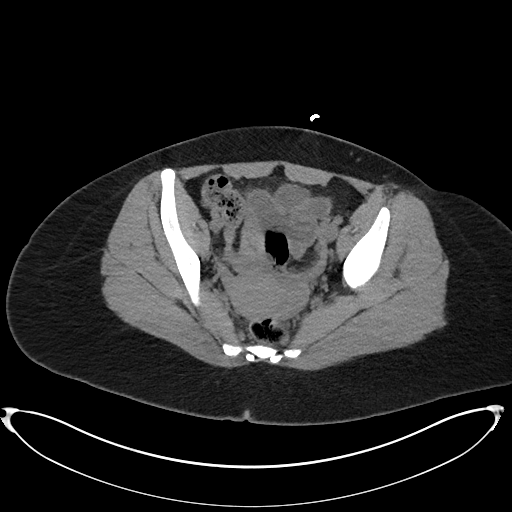
[im 33/91  soft-tissue]
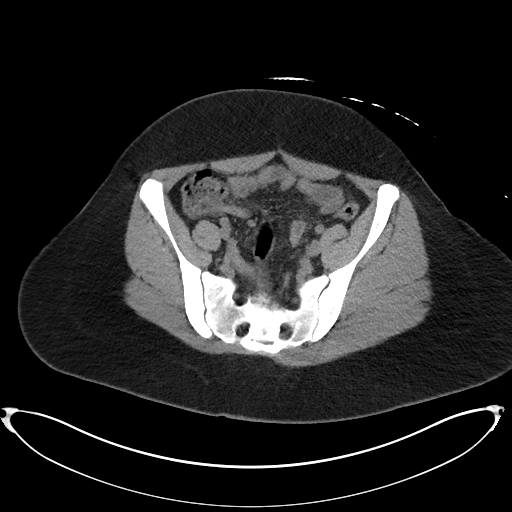
[im 40/91  soft-tissue]
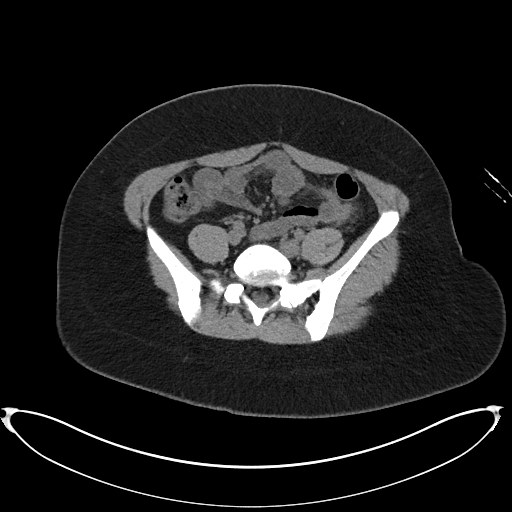
[im 47/91  soft-tissue]
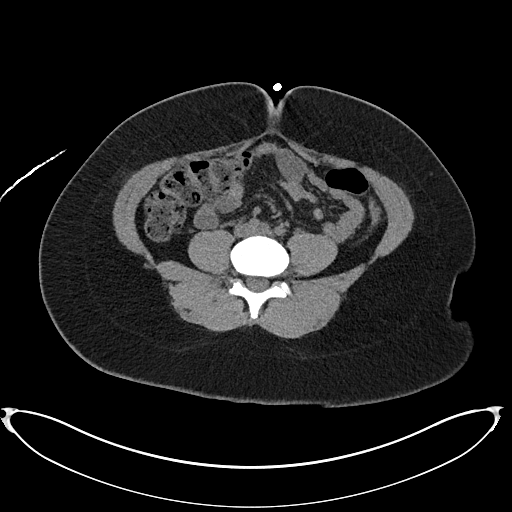
[im 51/91  soft-tissue]
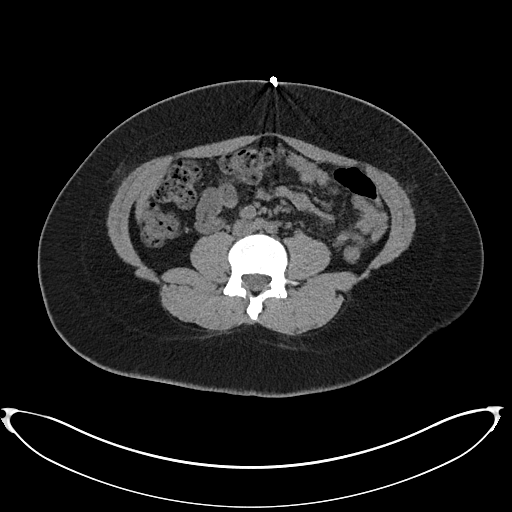
[im 58/91  soft-tissue]
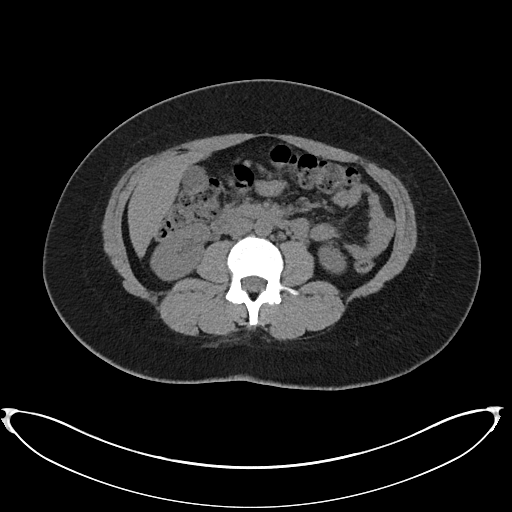
[im 58/91  bone]
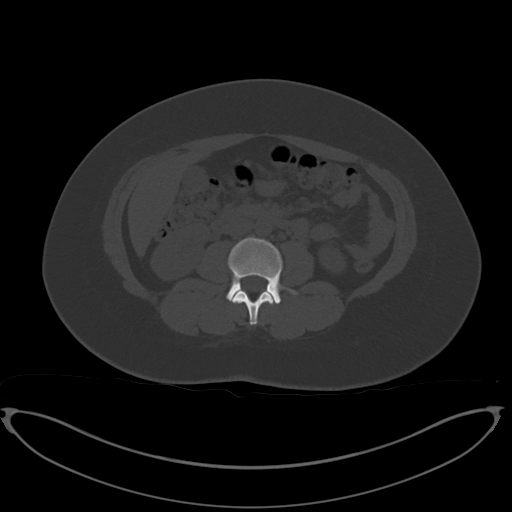
[im 65/91  soft-tissue]
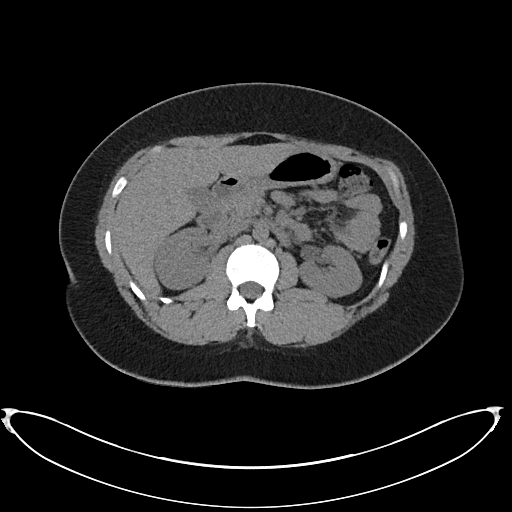
[im 73/91  soft-tissue]
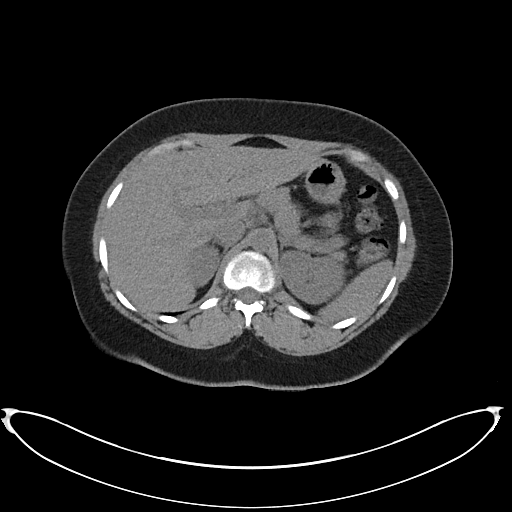
[im 80/91  soft-tissue]
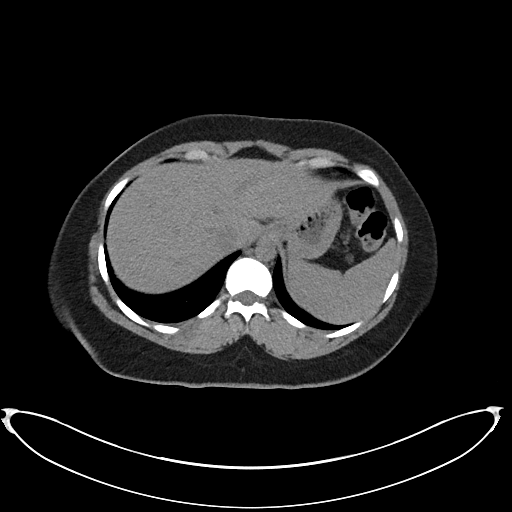
[im 87/91  soft-tissue]
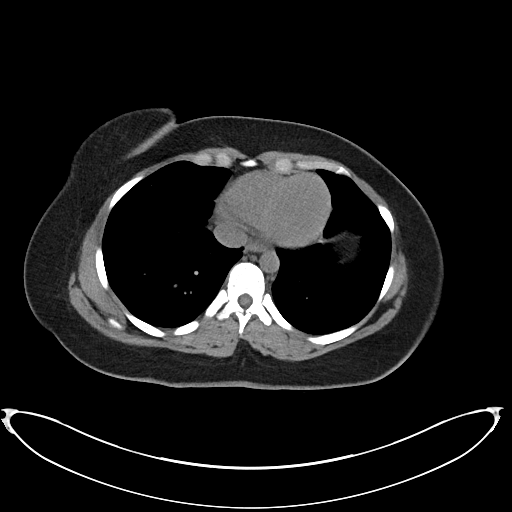

[Series 6: cor st · coronal · 0.93mm/px · 3 of 101 slices shown]
[im 34/101  soft-tissue]
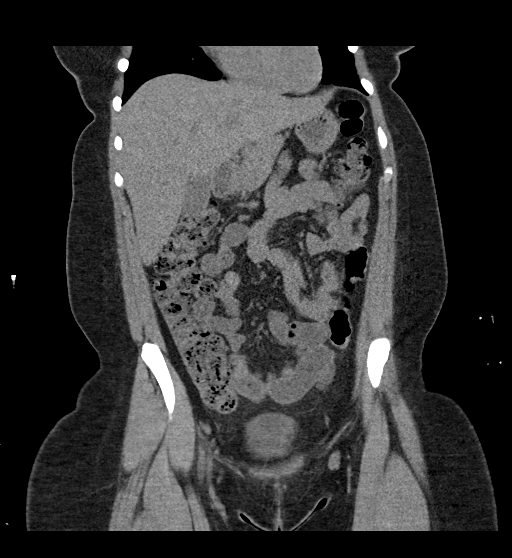
[im 45/101  soft-tissue]
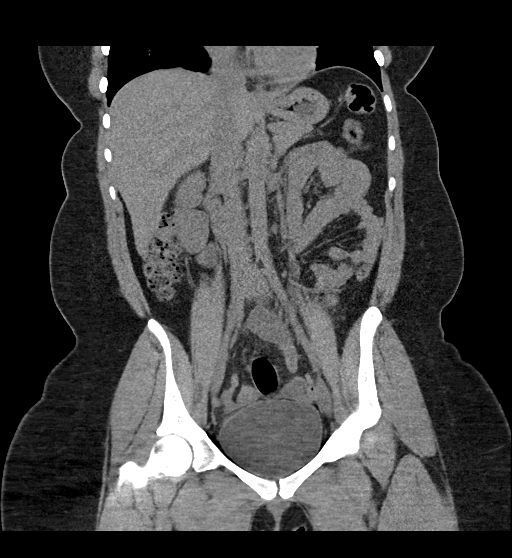
[im 56/101  soft-tissue]
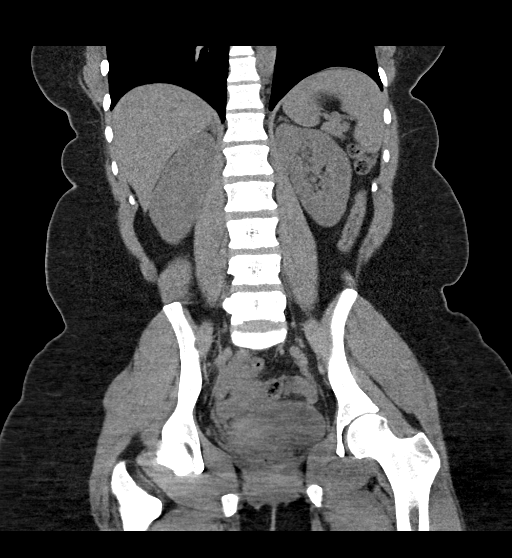

[16 of 46 positions shown; findings below may reference images not displayed]

FINDINGS: Lower chest: Clear lung bases.

Hepatobiliary: No focal liver abnormality is seen. No gallstones,
gallbladder wall thickening, or biliary dilatation.

Pancreas: Unremarkable. No pancreatic ductal dilatation or
surrounding inflammatory changes.

Spleen: Normal in size without focal abnormality.

Adrenals/Urinary Tract: Adrenal glands are unremarkable. Kidneys are
normal, without renal calculi, focal lesion, or hydronephrosis.
Bladder is unremarkable.

Stomach/Bowel: Appendix not definitively seen, but suggested
extending posteriorly from the cecal tip to merge with the adnexal
structures. This structure is 6 mm in diameter top normal in size
for appendix. There is no adjacent inflammation. No findings are
seen to suggest appendicitis.

Normal stomach. Small bowel and colon are normal in caliber. No wall
thickening. No inflammation.

Vascular/Lymphatic: No significant vascular findings are present. No
enlarged abdominal or pelvic lymph nodes.

Reproductive: Uterus and bilateral adnexa are unremarkable.

Other: No abdominal wall hernia or abnormality. No abdominopelvic
ascites.

Musculoskeletal: No acute or significant osseous findings.
IMPRESSION: 1. Normal exam. No evidence of appendicitis. No findings to account
for the patient's pain.

## 2024-03-11 ENCOUNTER — Encounter (HOSPITAL_COMMUNITY): Payer: Self-pay

## 2024-03-11 ENCOUNTER — Emergency Department (HOSPITAL_COMMUNITY)
Admission: EM | Admit: 2024-03-11 | Discharge: 2024-03-11 | Disposition: A | Discharge status: Home / Self Care | Payer: Self-pay | Attending: Emergency Medicine | Admitting: Emergency Medicine

## 2024-03-11 ENCOUNTER — Other Ambulatory Visit: Payer: Self-pay

## 2024-03-11 ENCOUNTER — Emergency Department (HOSPITAL_COMMUNITY): Payer: Self-pay

## 2024-03-11 DIAGNOSIS — F10920 Alcohol use, unspecified with intoxication, uncomplicated: Secondary | ICD-10-CM | POA: Insufficient documentation

## 2024-03-11 DIAGNOSIS — Y908 Blood alcohol level of 240 mg/100 ml or more: Secondary | ICD-10-CM | POA: Insufficient documentation

## 2024-03-11 DIAGNOSIS — Z7951 Long term (current) use of inhaled steroids: Secondary | ICD-10-CM | POA: Insufficient documentation

## 2024-03-11 DIAGNOSIS — J45909 Unspecified asthma, uncomplicated: Secondary | ICD-10-CM | POA: Diagnosis not present

## 2024-03-11 DIAGNOSIS — S0990XA Unspecified injury of head, initial encounter: Secondary | ICD-10-CM | POA: Insufficient documentation

## 2024-03-11 DIAGNOSIS — S0993XA Unspecified injury of face, initial encounter: Secondary | ICD-10-CM | POA: Diagnosis present

## 2024-03-11 DIAGNOSIS — W19XXXA Unspecified fall, initial encounter: Secondary | ICD-10-CM | POA: Insufficient documentation

## 2024-03-11 DIAGNOSIS — S01511A Laceration without foreign body of lip, initial encounter: Secondary | ICD-10-CM | POA: Diagnosis not present

## 2024-03-11 LAB — ETHANOL: Alcohol, Ethyl (B): 289 mg/dL — ABNORMAL HIGH (ref <15)

## 2024-03-11 NOTE — ED Triage Notes (Signed)
 Pt arrived via EMS from [facility] w/ c/o of a mechanical fall landed prone. No LOC reported. ETOH on board. Injury to lower lip.

## 2024-03-11 NOTE — ED Provider Notes (Signed)
  EMERGENCY DEPARTMENT AT [facility] Provider Note   CSN: [id number] Arrival date & time: 03/11/24  8090     Patient presents with: Fall and Alcohol Intoxication   Jennifer Stanley is a 28 y.o. female.   28 year old female with a past medical history of asthma presents to the ED via EMS due to alcohol intoxication along with fall.  According to report, patient was at a ENT homecoming, had a mechanical fall and landed prone.  Does have an injury to her right lower lip which is currently not actively bleeding.  Patient is currently asleep on stretcher.  Level 5 caveat.  The history is provided by the patient.  Fall  Alcohol Intoxication       Prior to Admission medications   Medication Sig Start Date End Date Taking? Authorizing Provider  albuterol  (VENTOLIN  HFA) 108 (90 Base) MCG/ACT inhaler Inhale 1-2 puffs into the lungs every 6 (six) hours as needed for wheezing or shortness of breath. 10/31/20   Christopher Savannah, PA-C  cetirizine  (ZYRTEC  ALLERGY) 10 MG tablet Take 1 tablet (10 mg total) by mouth daily. 10/31/20   Christopher Savannah, PA-C  fluticasone (FLONASE) 50 MCG/ACT nasal spray Place 2 sprays into both nostrils daily. 06/04/20   [provider]  predniSONE  (DELTASONE ) 10 MG tablet Take 1 tablet (10 mg total) by mouth daily with breakfast. 10/31/20   Christopher Savannah, PA-C  promethazine -dextromethorphan (PROMETHAZINE -DM) 6.25-15 MG/5ML syrup Take 5 mLs by mouth at bedtime as needed for cough. 10/31/20   Christopher Savannah, PA-C  pseudoephedrine  (SUDAFED) 60 MG tablet Take 1 tablet (60 mg total) by mouth every 8 (eight) hours as needed for congestion. 10/31/20   Christopher Savannah, PA-C  sertraline (ZOLOFT) 100 MG tablet Take 1.5 tablets by mouth daily. 09/09/20   [provider]  traZODone (DESYREL) 50 MG tablet Take 25-50 mg by mouth at bedtime as needed. 09/02/20   [provider]    Allergies: Penicillins    Review of Systems  Unable to perform ROS: Other    Updated  Vital Signs BP 131/69 (BP Location: Left Arm)   Pulse 78   Temp (!) 97.5 F (36.4 C) (Axillary)   Resp 14   SpO2 97%   Physical Exam Vitals and nursing note reviewed.  Constitutional:      Comments: Clinically intoxicated.   HENT:     Head: Normocephalic.     Comments: Bruising to the left cheekbone. Internal lip laceration right sided.     Mouth/Throat:     Mouth: Mucous membranes are moist.  Eyes:     Pupils: Pupils are equal, round, and reactive to light.     Comments: Pupils equal and reactive.   Cardiovascular:     Rate and Rhythm: Normal rate.  Pulmonary:     Effort: Pulmonary effort is normal.  Abdominal:     General: Abdomen is flat.  Skin:    General: Skin is warm and dry.  Neurological:     Mental Status: She is disoriented.     (all labs ordered are listed, but only abnormal results are displayed) Labs Reviewed  ETHANOL - Abnormal; Notable for the following components:      Result Value   Alcohol, Ethyl (B) 289 (*)    All other components within normal limits    EKG: None  Radiology: CT Maxillofacial Wo Contrast Result Date: 03/11/2024 CLINICAL DATA:  Facial trauma, blunt.  Fall. EXAM: CT MAXILLOFACIAL WITHOUT CONTRAST TECHNIQUE: Multidetector CT imaging of the  maxillofacial structures was performed. Multiplanar CT image reconstructions were also generated. RADIATION DOSE REDUCTION: This exam was performed according to the departmental dose-optimization program which includes automated exposure control, adjustment of the mA and/or kV according to patient size and/or use of iterative reconstruction technique. COMPARISON:  None Available. FINDINGS: Osseous: No fracture or mandibular dislocation. No destructive process. Orbits: No fracture or emphysema.  Globes intact. Sinuses: Clear Soft tissues: Negative Limited intracranial: See head CT report IMPRESSION: No facial or orbital fracture. Electronically Signed   By: Franky Crease M.D.   On: 03/11/2024 21:51    CT HEAD WO CONTRAST ( ) Result Date: 03/11/2024 CLINICAL DATA:  Facial trauma, blunt.  Fall. EXAM: CT HEAD WITHOUT CONTRAST TECHNIQUE: Contiguous axial images were obtained from the base of the skull through the vertex without intravenous contrast. RADIATION DOSE REDUCTION: This exam was performed according to the departmental dose-optimization program which includes automated exposure control, adjustment of the mA and/or kV according to patient size and/or use of iterative reconstruction technique. COMPARISON:  None Available. FINDINGS: Brain: No acute intracranial abnormality. Specifically, no hemorrhage, hydrocephalus, mass lesion, acute infarction, or significant intracranial injury. Vascular: No hyperdense vessel or unexpected calcification. Skull: No acute calvarial abnormality. Sinuses/Orbits: No acute findings Other: None IMPRESSION: No acute intracranial abnormality. Electronically Signed   By: Franky Crease M.D.   On: 03/11/2024 21:50     Procedures   Medications Ordered in the ED - No data to display  Clinical Course as of 03/11/24 2233  Sat Mar 11, 2024  2104 Alcohol, Ethyl (B)(!): 289 [JS]    Clinical Course User Index [JS] Tasman Zapata, PA-C                                 Medical Decision Making Amount and/or Complexity of Data Reviewed Labs: ordered. Decision-making details documented in ED Course. Radiology: ordered.   Presented to the ED status post fall via EMS from ENT.  Patient is not answering any of my questions, she is alert however falls asleep on and off.  She does smell strongly of alcohol.  There is some bruising noted to her left cheek.  She is unable to provide a full thorough history.  According to records she is not on any blood thinners.  Unable to perform neurological exam.  CT head, CT maxillofacial has been ordered.  CT head along with CT maxillofacial did not show any acute findings on today's visit.  She does have some bruising below the left  cheek.  She has been asleep on the stretcher for approximately 3 hours and 23 minutes.  Her friend arrived here today by the name of Bernardino, she reports she lives in [place] however she will be responsible for taking her home tonight.  Patient was ambulated by me and nursing staff.  She was able to get in and out of a wheelchair in order to void in the bathroom.  Her level on today's visit is 290, I did discuss this results with patient, she is aware that this is extremely high and likely can cause her harm.  She will hydrate at home, there is no other complaint that she has now that she is answering questions to me.  She states I just know I am not sober at all.  Patient is hemodynamically stable for discharge.   Portions of this note were generated with Scientist, clinical (histocompatibility and immunogenetics). Dictation errors may occur despite best attempts  at proofreading.   Final diagnoses:  Alcoholic intoxication without complication  Fall, initial encounter    ED Discharge Orders     None          Maureen Broad, PA-C 03/11/24 2233    Suzette Pac, MD 03/13/24 1054

## 2024-03-11 NOTE — Discharge Instructions (Addendum)
 The CT of your head did not show any acute findings.  The CT of your face did not show any fractures.  Your alcohol level was extremely high on today's visit.  Please hydrate with plenty of fluids at home.

## 2024-03-11 NOTE — ED Notes (Signed)
 Patient transported to CT

## 2024-03-11 NOTE — ED Notes (Signed)
 Also sent a light green and lavender to the lab.
